# Patient Record
Sex: Male | Born: 1955
Health system: Southern US, Community
[De-identification: ages and names within clinical notes are randomized; demographics above are authoritative.]

## PROBLEM LIST (undated history)

## (undated) DIAGNOSIS — R7303 Prediabetes: Secondary | ICD-10-CM

## (undated) DIAGNOSIS — R519 Headache, unspecified: Secondary | ICD-10-CM

## (undated) DIAGNOSIS — K219 Gastro-esophageal reflux disease without esophagitis: Secondary | ICD-10-CM

## (undated) DIAGNOSIS — G4700 Insomnia, unspecified: Secondary | ICD-10-CM

## (undated) DIAGNOSIS — K5730 Diverticulosis of large intestine without perforation or abscess without bleeding: Secondary | ICD-10-CM

## (undated) DIAGNOSIS — R440 Auditory hallucinations: Secondary | ICD-10-CM

## (undated) DIAGNOSIS — N529 Male erectile dysfunction, unspecified: Secondary | ICD-10-CM

## (undated) DIAGNOSIS — G20A1 Parkinson's disease without dyskinesia, without mention of fluctuations: Secondary | ICD-10-CM

## (undated) DIAGNOSIS — F900 Attention-deficit hyperactivity disorder, predominantly inattentive type: Secondary | ICD-10-CM

## (undated) DIAGNOSIS — M5430 Sciatica, unspecified side: Secondary | ICD-10-CM

## (undated) DIAGNOSIS — R972 Elevated prostate specific antigen [PSA]: Secondary | ICD-10-CM

## (undated) DIAGNOSIS — E7800 Pure hypercholesterolemia, unspecified: Secondary | ICD-10-CM

## (undated) DIAGNOSIS — I10 Essential (primary) hypertension: Secondary | ICD-10-CM

## (undated) DIAGNOSIS — I493 Ventricular premature depolarization: Secondary | ICD-10-CM

## (undated) DIAGNOSIS — R400 Somnolence: Secondary | ICD-10-CM

## (undated) DIAGNOSIS — Z87442 Personal history of urinary calculi: Secondary | ICD-10-CM

## (undated) DIAGNOSIS — F32A Depression, unspecified: Secondary | ICD-10-CM

## (undated) DIAGNOSIS — R319 Hematuria, unspecified: Secondary | ICD-10-CM

## (undated) DIAGNOSIS — L659 Nonscarring hair loss, unspecified: Secondary | ICD-10-CM

## (undated) DIAGNOSIS — F419 Anxiety disorder, unspecified: Secondary | ICD-10-CM

## (undated) HISTORY — DX: Gastro-esophageal reflux disease without esophagitis: K21.9

## (undated) HISTORY — DX: Somnolence: R40.0

## (undated) HISTORY — DX: Nonscarring hair loss, unspecified: L65.9

## (undated) HISTORY — DX: Attention-deficit hyperactivity disorder, predominantly inattentive type: F90.0

## (undated) HISTORY — DX: Prediabetes: R73.03

## (undated) HISTORY — DX: Male erectile dysfunction, unspecified: N52.9

## (undated) HISTORY — DX: Sciatica, unspecified side: M54.30

## (undated) HISTORY — DX: Parkinson's disease without dyskinesia, without mention of fluctuations: G20.A1

## (undated) HISTORY — PX: OTHER SURGICAL HISTORY: SHX169

## (undated) HISTORY — DX: Hematuria, unspecified: R31.9

## (undated) HISTORY — DX: Diverticulosis of large intestine without perforation or abscess without bleeding: K57.30

## (undated) HISTORY — DX: Insomnia, unspecified: G47.00

## (undated) HISTORY — DX: Elevated prostate specific antigen (PSA): R97.20

## (undated) HISTORY — DX: Essential (primary) hypertension: I10

## (undated) HISTORY — DX: Pure hypercholesterolemia, unspecified: E78.00

## (undated) HISTORY — PX: NO PAST SURGERIES: SHX2092

## (undated) HISTORY — DX: Auditory hallucinations: R44.0

---

## 1998-12-10 ENCOUNTER — Encounter: Payer: Self-pay | Admitting: Family Medicine

## 1998-12-10 ENCOUNTER — Ambulatory Visit (HOSPITAL_COMMUNITY): Admission: RE | Admit: 1998-12-10 | Discharge: 1998-12-10 | Payer: Self-pay | Admitting: Family Medicine

## 2000-01-05 ENCOUNTER — Ambulatory Visit (HOSPITAL_COMMUNITY): Admission: RE | Admit: 2000-01-05 | Discharge: 2000-01-05 | Payer: Self-pay

## 2003-04-15 ENCOUNTER — Ambulatory Visit (HOSPITAL_COMMUNITY): Admission: RE | Admit: 2003-04-15 | Discharge: 2003-04-15 | Payer: Self-pay | Admitting: Gastroenterology

## 2004-08-16 ENCOUNTER — Ambulatory Visit (HOSPITAL_COMMUNITY): Admission: RE | Admit: 2004-08-16 | Discharge: 2004-08-16 | Payer: Self-pay | Admitting: Urology

## 2004-08-16 ENCOUNTER — Emergency Department (HOSPITAL_COMMUNITY): Admission: EM | Admit: 2004-08-16 | Discharge: 2004-08-17 | Payer: Self-pay | Admitting: Emergency Medicine

## 2005-10-19 ENCOUNTER — Ambulatory Visit (HOSPITAL_BASED_OUTPATIENT_CLINIC_OR_DEPARTMENT_OTHER): Admission: RE | Admit: 2005-10-19 | Discharge: 2005-10-19 | Payer: Self-pay | Admitting: Urology

## 2006-08-25 ENCOUNTER — Ambulatory Visit (HOSPITAL_BASED_OUTPATIENT_CLINIC_OR_DEPARTMENT_OTHER): Admission: RE | Admit: 2006-08-25 | Discharge: 2006-08-25 | Payer: Self-pay | Admitting: Orthopedic Surgery

## 2010-09-23 ENCOUNTER — Encounter
Admission: RE | Admit: 2010-09-23 | Discharge: 2010-09-23 | Payer: Self-pay | Source: Home / Self Care | Attending: Family Medicine | Admitting: Family Medicine

## 2012-05-07 ENCOUNTER — Other Ambulatory Visit: Payer: Self-pay | Admitting: Occupational Medicine

## 2012-05-07 ENCOUNTER — Ambulatory Visit (HOSPITAL_BASED_OUTPATIENT_CLINIC_OR_DEPARTMENT_OTHER)
Admission: RE | Admit: 2012-05-07 | Discharge: 2012-05-07 | Disposition: A | Discharge status: Home / Self Care | Payer: Self-pay | Source: Ambulatory Visit | Attending: Occupational Medicine | Admitting: Occupational Medicine

## 2012-05-07 DIAGNOSIS — Z0000 Encounter for general adult medical examination without abnormal findings: Secondary | ICD-10-CM

## 2013-08-20 ENCOUNTER — Other Ambulatory Visit: Payer: Self-pay | Admitting: Gastroenterology

## 2014-03-08 ENCOUNTER — Encounter (HOSPITAL_BASED_OUTPATIENT_CLINIC_OR_DEPARTMENT_OTHER): Payer: Self-pay | Admitting: Emergency Medicine

## 2014-03-08 ENCOUNTER — Emergency Department (HOSPITAL_BASED_OUTPATIENT_CLINIC_OR_DEPARTMENT_OTHER)
Admission: EM | Admit: 2014-03-08 | Discharge: 2014-03-08 | Disposition: A | Discharge status: Home / Self Care | Payer: 59 | Attending: Emergency Medicine | Admitting: Emergency Medicine

## 2014-03-08 DIAGNOSIS — R109 Unspecified abdominal pain: Secondary | ICD-10-CM | POA: Insufficient documentation

## 2014-03-08 DIAGNOSIS — R300 Dysuria: Secondary | ICD-10-CM | POA: Insufficient documentation

## 2014-03-08 DIAGNOSIS — N529 Male erectile dysfunction, unspecified: Secondary | ICD-10-CM | POA: Insufficient documentation

## 2014-03-08 DIAGNOSIS — R319 Hematuria, unspecified: Secondary | ICD-10-CM | POA: Insufficient documentation

## 2014-03-08 DIAGNOSIS — I10 Essential (primary) hypertension: Secondary | ICD-10-CM | POA: Insufficient documentation

## 2014-03-08 DIAGNOSIS — R350 Frequency of micturition: Secondary | ICD-10-CM | POA: Insufficient documentation

## 2014-03-08 DIAGNOSIS — Z79899 Other long term (current) drug therapy: Secondary | ICD-10-CM | POA: Insufficient documentation

## 2014-03-08 LAB — URINALYSIS, ROUTINE W REFLEX MICROSCOPIC
Bilirubin Urine: NEGATIVE
Glucose, UA: NEGATIVE mg/dL
Ketones, ur: NEGATIVE mg/dL
Leukocytes, UA: NEGATIVE
NITRITE: NEGATIVE
Protein, ur: NEGATIVE mg/dL
SPECIFIC GRAVITY, URINE: 1.013 (ref 1.005–1.030)
Urobilinogen, UA: 0.2 mg/dL (ref 0.0–1.0)
pH: 5 (ref 5.0–8.0)

## 2014-03-08 LAB — CBC WITH DIFFERENTIAL/PLATELET
Basophils Absolute: 0 10*3/uL (ref 0.0–0.1)
Basophils Relative: 1 % (ref 0–1)
EOS PCT: 3 % (ref 0–5)
Eosinophils Absolute: 0.2 10*3/uL (ref 0.0–0.7)
HCT: 37.8 % — ABNORMAL LOW (ref 39.0–52.0)
Hemoglobin: 12.9 g/dL — ABNORMAL LOW (ref 13.0–17.0)
LYMPHS PCT: 37 % (ref 12–46)
Lymphs Abs: 2.5 10*3/uL (ref 0.7–4.0)
MCH: 29.6 pg (ref 26.0–34.0)
MCHC: 34.1 g/dL (ref 30.0–36.0)
MCV: 86.7 fL (ref 78.0–100.0)
Monocytes Absolute: 0.7 10*3/uL (ref 0.1–1.0)
Monocytes Relative: 10 % (ref 3–12)
NEUTROS ABS: 3.3 10*3/uL (ref 1.7–7.7)
Neutrophils Relative %: 49 % (ref 43–77)
PLATELETS: 257 10*3/uL (ref 150–400)
RBC: 4.36 MIL/uL (ref 4.22–5.81)
RDW: 13.9 % (ref 11.5–15.5)
WBC: 6.7 10*3/uL (ref 4.0–10.5)

## 2014-03-08 LAB — COMPREHENSIVE METABOLIC PANEL
ALT: 16 U/L (ref 0–53)
AST: 17 U/L (ref 0–37)
Albumin: 3.9 g/dL (ref 3.5–5.2)
Alkaline Phosphatase: 59 U/L (ref 39–117)
Anion gap: 12 (ref 5–15)
BUN: 15 mg/dL (ref 6–23)
CALCIUM: 9 mg/dL (ref 8.4–10.5)
CHLORIDE: 102 meq/L (ref 96–112)
CO2: 25 mEq/L (ref 19–32)
Creatinine, Ser: 0.9 mg/dL (ref 0.50–1.35)
GFR calc Af Amer: >90 mL/min (ref >90)
Glucose, Bld: 99 mg/dL (ref 70–99)
Potassium: 3.9 mEq/L (ref 3.7–5.3)
SODIUM: 139 meq/L (ref 137–147)
Total Bilirubin: 0.3 mg/dL (ref 0.3–1.2)
Total Protein: 7.2 g/dL (ref 6.0–8.3)

## 2014-03-08 LAB — URINE MICROSCOPIC-ADD ON

## 2014-03-08 LAB — LIPASE, BLOOD: Lipase: 58 U/L (ref 11–59)

## 2014-03-08 MED ORDER — SODIUM CHLORIDE 0.9 % IV SOLN
1000.0000 mL | Freq: Once | INTRAVENOUS | Status: AC
  Administered 2014-03-08: 1000 mL via INTRAVENOUS

## 2014-03-08 MED ORDER — SODIUM CHLORIDE 0.9 % IV SOLN: 1000.0000 mL | INTRAVENOUS | Status: DC

## 2014-03-08 NOTE — ED Notes (Signed)
Pt reports hematuria and bilateral flank pain that started today.  Urinary frequency, dysuria, and urgency.

## 2014-03-08 NOTE — ED Provider Notes (Signed)
CSN: [id number]     Arrival date & time 03/08/14  3532 History  This chart was scribed for Dorie Rank, MD by Irene Pap, ED Scribe. This patient was seen in room MH07/MH07 and patient care was started at 7:50 PM.    Chief Complaint  Patient presents with  . Hematuria   The history is provided by the patient. No language interpreter was used.   HPI Comments: Bryan Sosa is a 58 y.o. male with a history of GERD who presents to the Emergency Department complaining of hematuria onset earlier today. He reports two episodes of hematuria today. He states the blood is bright red. He reports associated bilateral flank pain, frequency and dysuria. He reports that he was dehydrated a few days ago, feeling light headedness, lowered BP and heavy diarrhea, but these symptoms have since subsided when he stopped taking his BP medication. He denies fever, nausea and vomiting.   Past Medical History  Diagnosis Date  . ED (erectile dysfunction)   . GERD (gastroesophageal reflux disease)   . Insomnia   . Hypertension    History reviewed. No pertinent past surgical history. Family History  Problem Relation Age of Onset  . Ulcers Mother   . Heart attack Father    History  Substance Use Topics  . Smoking status: Never Smoker   . Smokeless tobacco: Not on file  . Alcohol Use: Not on file    Review of Systems  Constitutional: Negative for fever.  Gastrointestinal: Negative for nausea and vomiting.  Genitourinary: Positive for dysuria, frequency, hematuria and flank pain.  A complete 10 system review of systems was obtained and all systems are negative except as noted in the HPI and PMH.   Allergies  Cyclobenzaprine and Sulfa antibiotics  Home Medications   Prior to Admission medications   Medication Sig Start Date End Date Taking? Authorizing Provider  diphenhydrAMINE (BENADRYL) 25 mg capsule Take 25 mg by mouth every 6 (six) hours as needed.    Historical Provider, MD  dutasteride  (AVODART) 0.5 MG capsule Take 0.5 mg by mouth daily.    Historical Provider, MD  hydrochlorothiazide (HYDRODIURIL) 25 MG tablet Take 25 mg by mouth daily.    Historical Provider, MD  metoprolol succinate (TOPROL-XL) 25 MG 24 hr tablet Take 25 mg by mouth daily.    Historical Provider, MD  RABEprazole (ACIPHEX) 20 MG tablet Take 20 mg by mouth daily.    Historical Provider, MD  tadalafil (CIALIS) 5 MG tablet Take 5 mg by mouth daily as needed for erectile dysfunction.    Historical Provider, MD  valsartan (DIOVAN) 320 MG tablet Take 320 mg by mouth daily.    Historical Provider, MD   BP 122/74  Pulse 78  Temp(Src) 97.4 F (36.3 C) (Oral)  Resp 18  Ht 6\' 2"  (1.88 m)  Wt 220 lb (99.791 kg)  BMI 28.23 kg/m2  SpO2 98% Physical Exam  Nursing note and vitals reviewed. Constitutional: He appears well-developed and well-nourished. No distress.  HENT:  Head: Normocephalic and atraumatic.  Right Ear: External ear normal.  Left Ear: External ear normal.  Eyes: Conjunctivae are normal. Right eye exhibits no discharge. Left eye exhibits no discharge. No scleral icterus.  Neck: Neck supple. No tracheal deviation present.  Cardiovascular: Normal rate, regular rhythm and intact distal pulses.   Pulmonary/Chest: Effort normal and breath sounds normal. No stridor. No respiratory distress. He has no wheezes. He has no rales.  Abdominal: Soft. Bowel sounds are normal. He exhibits  no distension. There is no tenderness. There is no rebound and no guarding.  Musculoskeletal: He exhibits no edema and no tenderness.  Neurological: He is alert. He has normal strength. No cranial nerve deficit (no facial droop, extraocular movements intact, no slurred speech) or sensory deficit. He exhibits normal muscle tone. He displays no seizure activity. Coordination normal.  Skin: Skin is warm and dry. No rash noted.  Psychiatric: He has a normal mood and affect.   ED Course  Procedures (including critical care  time) DIAGNOSTIC STUDIES: Oxygen Saturation is 98% on room air, normal by my interpretation.    COORDINATION OF CARE: 7:53 PM-Discussed treatment plan which includes labs with pt at bedside and pt agreed to plan.   Labs Review Labs Reviewed  URINALYSIS, ROUTINE W REFLEX MICROSCOPIC - Abnormal; Notable for the following:    Hgb urine dipstick TRACE (*)    All other components within normal limits  CBC WITH DIFFERENTIAL - Abnormal; Notable for the following:    Hemoglobin 12.9 (*)    HCT 37.8 (*)    All other components within normal limits  URINE MICROSCOPIC-ADD ON - Abnormal; Notable for the following:    Casts HYALINE CASTS (*)    All other components within normal limits  URINE CULTURE  COMPREHENSIVE METABOLIC PANEL  LIPASE, BLOOD     MDM   Final diagnoses:  Hematuria   Trace hemoglobin but no obvious uti based on UA.  Will send off urine culture.  Pt is not having significant pain.  Discussed CT scan in the ED vs outpatient follow up.  Pt was primarily concerned about the blood.  It is possible he had a kidney stone.  Pt will follow up with PCP to have the urine retested next week.   Return to ED for recurrent, worsening symptoms  I personally performed the services described in this documentation, which was scribed in my presence.  The recorded information has been reviewed and is accurate.    Dorie Rank, MD 03/08/14 2122

## 2014-03-08 NOTE — Discharge Instructions (Signed)
Hematuria, Adult  Hematuria is blood in your urine. It can be caused by a bladder infection, kidney infection, prostate infection, kidney stone, or cancer of your urinary tract. Infections can usually be treated with medicine, and a kidney stone usually will pass through your urine. If neither of these is the cause of your hematuria, further workup to find out the reason may be needed.  It is very important that you tell your health care provider about any blood you see in your urine, even if the blood stops without treatment or happens without causing pain. Blood in your urine that happens and then stops and then happens again can be a symptom of a very serious condition. Also, pain is not a symptom in the initial stages of many urinary cancers.  HOME CARE INSTRUCTIONS   · Drink lots of fluid, 3-4 quarts a day. If you have been diagnosed with an infection, cranberry juice is especially recommended, in addition to large amounts of water.  · Avoid caffeine, tea, and carbonated beverages, because they tend to irritate the bladder.  · Avoid alcohol because it may irritate the prostate.  · Only take over-the-counter or prescription medicines for pain, discomfort, or fever as directed by your health care provider.  · If you have been diagnosed with a kidney stone, follow your health care provider's instructions regarding straining your urine to catch the stone.  · Empty your bladder often. Avoid holding urine for long periods of time.  · After a bowel movement, women should cleanse front to back. Use each tissue only once.  · Empty your bladder before and after sexual intercourse if you are a male.  SEEK MEDICAL CARE IF:  You develop back pain, fever, a feeling of sickness in your stomach (nausea), or vomiting or if your symptoms are not better in 3 days. Return sooner if you are getting worse.  SEEK IMMEDIATE MEDICAL CARE IF:   · You have a persistent fever, with a temperature of 101.8°F (38.8°C) or greater.  · You  develop severe vomiting and are unable to keep the medicine down.  · You develop severe back or abdominal pain despite taking your medicines.  · You begin passing a large amount of blood or clots in your urine.  · You feel extremely weak or faint, or you pass out.  MAKE SURE YOU:   · Understand these instructions.  · Will watch your condition.  · Will get help right away if you are not doing well or get worse.  Document Released: 08/15/2005 Document Revised: 06/05/2013 Document Reviewed: 04/15/2013  Exit[employer]® Patient Information ©2015 [employer]. This information is not intended to replace advice given to you by your health care provider. Make sure you discuss any questions you have with your health care provider.

## 2014-03-10 LAB — URINE CULTURE
Colony Count: NO GROWTH
Culture: NO GROWTH

## 2014-10-08 ENCOUNTER — Other Ambulatory Visit: Payer: Self-pay | Admitting: *Deleted

## 2014-10-08 DIAGNOSIS — R002 Palpitations: Secondary | ICD-10-CM

## 2014-10-10 ENCOUNTER — Encounter: Payer: Self-pay | Admitting: Radiology

## 2014-10-10 ENCOUNTER — Encounter (INDEPENDENT_AMBULATORY_CARE_PROVIDER_SITE_OTHER): Payer: 59

## 2014-10-10 DIAGNOSIS — R002 Palpitations: Secondary | ICD-10-CM

## 2014-10-10 NOTE — Progress Notes (Signed)
Patient ID: Bryan Sosa, male   DOB: Jan 06, 1956, 59 y.o.   MRN: [id number] Lif[employer] 30 day monitor applied. EOS 11-08-14

## 2015-05-07 ENCOUNTER — Ambulatory Visit (INDEPENDENT_AMBULATORY_CARE_PROVIDER_SITE_OTHER): Payer: 59 | Admitting: Cardiology

## 2015-05-07 ENCOUNTER — Encounter: Payer: Self-pay | Admitting: Cardiology

## 2015-05-07 VITALS — BP 130/82 | HR 74 | Ht 73.0 in | Wt 234.6 lb

## 2015-05-07 DIAGNOSIS — E669 Obesity, unspecified: Secondary | ICD-10-CM | POA: Diagnosis not present

## 2015-05-07 DIAGNOSIS — Z8249 Family history of ischemic heart disease and other diseases of the circulatory system: Secondary | ICD-10-CM | POA: Diagnosis not present

## 2015-05-07 DIAGNOSIS — I10 Essential (primary) hypertension: Secondary | ICD-10-CM | POA: Diagnosis not present

## 2015-05-07 DIAGNOSIS — R079 Chest pain, unspecified: Secondary | ICD-10-CM | POA: Diagnosis not present

## 2015-05-07 DIAGNOSIS — Z6834 Body mass index (BMI) 34.0-34.9, adult: Secondary | ICD-10-CM | POA: Insufficient documentation

## 2015-05-07 HISTORY — DX: Body mass index (BMI) 34.0-34.9, adult: Z68.34

## 2015-05-07 HISTORY — DX: Essential (primary) hypertension: I10

## 2015-05-07 NOTE — Progress Notes (Signed)
Cardiology Office Note   Date:  05/07/2015   ID:  Bryan Sosa, DOB 05/13/1956, MRN [id number]  PCP:  Vena Austria, MD  Cardiologist:   Candee Furbish, MD      History of Present Illness: Bryan Sosa is a 59 y.o. male who presents for evaluation of chest pain. Describes the chest pain as "chest twinges ". Can be sitting and have it. Last few days ok. Heart pinch. Has had GERD. Mild dyspnea with exertion.   Anterior chest pain with history of right bundle branch block was father died of massive heart attack in his early 27s. No associated shortness of breath. At one point, sitting when he felt discomfort. No vomiting.  In 20's PVC's, benign were diagnosed.   Retired Agricultural consultant.  He is also taking 3 antihypertensives. We discussed this. Weight loss. Obesity.  Was given meloxicam in case it was musculoskeletal discomfort.  Past Medical History  Diagnosis Date  . ED (erectile dysfunction)   . GERD (gastroesophageal reflux disease)   . Insomnia   . Hypertension     Past Surgical History  Procedure Laterality Date  . No past surgeries       Current Outpatient Prescriptions  Medication Sig Dispense Refill  . amLODipine (NORVASC) 5 MG tablet Take 5 mg by mouth daily.    Marland Kitchen aspirin 325 MG tablet Take 325 mg by mouth daily.    . diphenhydrAMINE (BENADRYL) 25 mg capsule Take 25 mg by mouth every 6 (six) hours as needed.    . dutasteride (AVODART) 0.5 MG capsule Take 0.5 mg by mouth daily.    . meloxicam (MOBIC) 15 MG tablet     . metoprolol succinate (TOPROL-XL) 25 MG 24 hr tablet Take 25 mg by mouth daily.    . metoprolol tartrate (LOPRESSOR) 25 MG tablet     . RABEprazole (ACIPHEX) 20 MG tablet Take 20 mg by mouth daily.    . tadalafil (CIALIS) 5 MG tablet Take 5 mg by mouth daily as needed for erectile dysfunction.    . valsartan (DIOVAN) 320 MG tablet Take 320 mg by mouth daily.     No current facility-administered medications for this visit.    Allergies:    Cyclobenzaprine and Sulfa antibiotics    Social History:  The patient  reports that he has never smoked. He does not have any smokeless tobacco history on file.   Family History:  The patient's family history includes Heart attack in his father; Ulcers in his mother.    ROS:  Please see the history of present illness.   Otherwise, review of systems are positive for none.   All other systems are reviewed and negative.    PHYSICAL EXAM: VS:  BP 130/82 mmHg  Pulse 74  Ht 6\' 1"  (1.854 m)  Wt 234 lb 9.6 oz (106.414 kg)  BMI 30.96 kg/m2 , BMI Body mass index is 30.96 kg/(m^2). GEN: Well nourished, well developed, in no acute distress HEENT: normal Neck: no JVD, carotid bruits, or masses Cardiac: RRR; no murmurs, rubs, or gallops,no edema  Respiratory:  clear to auscultation bilaterally, normal work of breathing GI: soft, nontender, nondistended, + BS MS: no deformity or atrophy Skin: warm and dry, no rash Neuro:  Strength and sensation are intact Psych: euthymic mood, full affect   EKG:  EKG is ordered today. The ekg ordered today 05/07/15 shows normal sinus rhythm, 74 with incomplete right bundle-branch pattern.   Recent Labs: No results found for requested labs  within last 365 days.    Lipid Panel No results found for: CHOL, TRIG, HDL, CHOLHDL, VLDL, LDLCALC, LDLDIRECT    Wt Readings from Last 3 Encounters:  05/07/15 234 lb 9.6 oz (106.414 kg)  03/08/14 220 lb (99.791 kg)    Prior lab work from July 2015 showed normal liver function, creatinine 0.9, hemoglobin 12.9  Other studies Reviewed: Additional studies/ records that were reviewed today include: Prior office notes reviewed, EKG reviewed. Review of the above records demonstrates: As above   ASSESSMENT AND PLAN:  1.  Atypical chest pain-described as chest twinges, possibly musculoskeletal. Nonetheless, his father had heart attack at age 80, he is a retired Agricultural consultant which increases occupational exposure hazards/risk  of cardiovascular disease. We will go ahead and order a stress echocardiogram to exclude ischemia. If this is reassuring, he may proceed with walking/jogging.  2. Essential hypertension-3 antihypertensives. Weight loss will be helpful with this.  3. Obesity-technically BMI greater than 30. He was 15 pounds lighter in 2015. Continue to encourage walking, exercise, watch diet. Prevention.  4. Family history of CAD-father MI 60. He states that his lipids have been good, HDL has been low at times. Encourage exercise.   Current medicines are reviewed at length with the patient today.  The patient does not have concerns regarding medicines.  The following changes have been made:  no change  Labs/ tests ordered today include:   Orders Placed This Encounter  Procedures  . EKG 12-Lead  . Echo stress     Disposition:   I will follow-up with results of stress testing. In the future, if chest pain becomes more worrisome, I'll be happy to see him.  Bobby Rumpf, MD  05/07/2015 4:46 PM    [facility] [address], [place], [place]  [address] Phone: [phone]; Fax: [phone]

## 2015-05-07 NOTE — Patient Instructions (Signed)
Medication Instructions:  The current medical regimen is effective;  continue present plan and medications.  Testing/Procedures: Your physician has requested that you have a stress echocardiogram. For further information please visit [email or web address]. Please follow instruction sheet as given.  Follow-Up: Follow up as needed after testing.  Thank you for choosing [facility]!!     

## 2015-05-20 ENCOUNTER — Ambulatory Visit (HOSPITAL_COMMUNITY): Payer: 59 | Attending: Cardiology

## 2015-05-20 DIAGNOSIS — Z8249 Family history of ischemic heart disease and other diseases of the circulatory system: Secondary | ICD-10-CM | POA: Diagnosis not present

## 2015-05-20 DIAGNOSIS — E669 Obesity, unspecified: Secondary | ICD-10-CM | POA: Insufficient documentation

## 2015-05-20 DIAGNOSIS — R079 Chest pain, unspecified: Secondary | ICD-10-CM | POA: Insufficient documentation

## 2015-05-20 DIAGNOSIS — Z6830 Body mass index (BMI) 30.0-30.9, adult: Secondary | ICD-10-CM | POA: Diagnosis not present

## 2015-05-20 DIAGNOSIS — I10 Essential (primary) hypertension: Secondary | ICD-10-CM | POA: Insufficient documentation

## 2015-11-15 ENCOUNTER — Encounter (HOSPITAL_COMMUNITY): Payer: Self-pay | Admitting: Emergency Medicine

## 2015-11-15 ENCOUNTER — Emergency Department (HOSPITAL_COMMUNITY): Payer: 59

## 2015-11-15 ENCOUNTER — Emergency Department (HOSPITAL_COMMUNITY)
Admission: EM | Admit: 2015-11-15 | Discharge: 2015-11-15 | Disposition: A | Discharge status: Home / Self Care | Payer: 59 | Attending: Emergency Medicine | Admitting: Emergency Medicine

## 2015-11-15 DIAGNOSIS — Z8669 Personal history of other diseases of the nervous system and sense organs: Secondary | ICD-10-CM | POA: Insufficient documentation

## 2015-11-15 DIAGNOSIS — N529 Male erectile dysfunction, unspecified: Secondary | ICD-10-CM | POA: Insufficient documentation

## 2015-11-15 DIAGNOSIS — Z79899 Other long term (current) drug therapy: Secondary | ICD-10-CM | POA: Diagnosis not present

## 2015-11-15 DIAGNOSIS — R079 Chest pain, unspecified: Secondary | ICD-10-CM | POA: Insufficient documentation

## 2015-11-15 DIAGNOSIS — I10 Essential (primary) hypertension: Secondary | ICD-10-CM | POA: Diagnosis not present

## 2015-11-15 DIAGNOSIS — M545 Low back pain: Secondary | ICD-10-CM | POA: Diagnosis not present

## 2015-11-15 DIAGNOSIS — Z8719 Personal history of other diseases of the digestive system: Secondary | ICD-10-CM | POA: Insufficient documentation

## 2015-11-15 DIAGNOSIS — R002 Palpitations: Secondary | ICD-10-CM | POA: Insufficient documentation

## 2015-11-15 LAB — BASIC METABOLIC PANEL
Anion gap: 11 (ref 5–15)
BUN: 12 mg/dL (ref 6–20)
CHLORIDE: 105 mmol/L (ref 101–111)
CO2: 24 mmol/L (ref 22–32)
Calcium: 9 mg/dL (ref 8.9–10.3)
Creatinine, Ser: 0.9 mg/dL (ref 0.61–1.24)
GFR calc Af Amer: >60 mL/min (ref >60)
GFR calc non Af Amer: >60 mL/min (ref >60)
Glucose, Bld: 115 mg/dL — ABNORMAL HIGH (ref 65–99)
Potassium: 3.5 mmol/L (ref 3.5–5.1)
Sodium: 140 mmol/L (ref 135–145)

## 2015-11-15 LAB — CBC
HCT: 42.6 % (ref 39.0–52.0)
Hemoglobin: 14.2 g/dL (ref 13.0–17.0)
MCH: 28.7 pg (ref 26.0–34.0)
MCHC: 33.3 g/dL (ref 30.0–36.0)
MCV: 86.1 fL (ref 78.0–100.0)
PLATELETS: 294 10*3/uL (ref 150–400)
RBC: 4.95 MIL/uL (ref 4.22–5.81)
RDW: 13.9 % (ref 11.5–15.5)
WBC: 7.2 10*3/uL (ref 4.0–10.5)

## 2015-11-15 LAB — I-STAT TROPONIN, ED
Troponin i, poc: 0 ng/mL (ref 0.00–0.08)
Troponin i, poc: 0 ng/mL (ref 0.00–0.08)

## 2015-11-15 MED ORDER — ACETAMINOPHEN 500 MG PO TABS
1000.0000 mg | ORAL_TABLET | Freq: Once | ORAL | Status: AC
Start: 2015-11-15 — End: 2015-11-15
  Administered 2015-11-15: 1000 mg via ORAL
  Filled 2015-11-15: qty 2

## 2015-11-15 MED ORDER — KETOROLAC TROMETHAMINE 30 MG/ML IJ SOLN
30.0000 mg | Freq: Once | INTRAMUSCULAR | Status: AC
  Administered 2015-11-15: 30 mg via INTRAVENOUS
  Filled 2015-11-15: qty 1

## 2015-11-15 MED ORDER — ASPIRIN 81 MG PO CHEW
324.0000 mg | CHEWABLE_TABLET | Freq: Once | ORAL | Status: AC
  Administered 2015-11-15: 324 mg via ORAL
  Filled 2015-11-15: qty 4

## 2015-11-15 MED ORDER — NITROGLYCERIN 0.4 MG SL SUBL: 0.4000 mg | SUBLINGUAL_TABLET | SUBLINGUAL | Status: DC | PRN

## 2015-11-15 NOTE — ED Notes (Signed)
Pt reports waking up to feeling of palpitations this am.  Worse when laying on his L side.  Pt reports this has happened in the past and had a stress test - unremarkable.  Pt denies any SOB, dizziness or any cp at this time.  Pt is A&Ox 4.

## 2015-11-15 NOTE — ED Provider Notes (Signed)
CSN: [id number]     Arrival date & time 11/15/15  [id number] History   First MD Initiated Contact with Patient 11/15/15 0735     Chief Complaint  Patient presents with  . Chest Pain     (Consider location/radiation/quality/duration/timing/severity/associated sxs/prior Treatment) HPI Comments: Laying in bed, last night felt like couldn't get comfortable laying on either side Felt heart beating very fast Face felt flushed Heart racing, started around 3AM Felt better when laying flat on back versus laying on sides felt uncomfortable  Now having chest pressure x hours Feels it in left back, no other radiation No shortness of breath No numbness/weakness Nausea a few times, no vomiting No history of CP like this or palpitations, has had PVCs No exertional pain Father with MI in 78s No smoking Stress test in september    Patient is a 60 y.o. male presenting with chest pain.  Chest Pain Associated symptoms: palpitations   Associated symptoms: no abdominal pain, no back pain, no diaphoresis, no fever, no headache, no nausea, no shortness of breath and not vomiting     Past Medical History  Diagnosis Date  . ED (erectile dysfunction)   . GERD (gastroesophageal reflux disease)   . Insomnia   . Hypertension    Past Surgical History  Procedure Laterality Date  . No past surgeries     Family History  Problem Relation Age of Onset  . Ulcers Mother   . Heart attack Father    Social History  Substance Use Topics  . Smoking status: Never Smoker   . Smokeless tobacco: None  . Alcohol Use: None    Review of Systems  Constitutional: Negative for fever and diaphoresis.  HENT: Negative for sore throat.   Eyes: Negative for visual disturbance.  Respiratory: Negative for shortness of breath.   Cardiovascular: Positive for chest pain and palpitations. Negative for leg swelling (has noticed bilateral sock lines).  Gastrointestinal: Negative for nausea, vomiting and abdominal pain.   Genitourinary: Negative for difficulty urinating.  Musculoskeletal: Negative for back pain and neck stiffness.  Skin: Negative for rash.  Neurological: Negative for syncope and headaches.      Allergies  Sulfa antibiotics  Home Medications   Prior to Admission medications   Medication Sig Start Date End Date Taking? Authorizing Provider  amLODipine (NORVASC) 5 MG tablet Take 5 mg by mouth daily.   Yes Historical Provider, MD  dutasteride (AVODART) 0.5 MG capsule Take 0.5 mg by mouth daily.   Yes Historical Provider, MD  metoprolol tartrate (LOPRESSOR) 25 MG tablet Take 25 mg by mouth 2 (two) times daily.  04/25/15  Yes Historical Provider, MD  omeprazole (PRILOSEC) 20 MG capsule Take 20 mg by mouth daily.   Yes Historical Provider, MD  RABEprazole (ACIPHEX) 20 MG tablet Take 20 mg by mouth daily.   Yes Historical Provider, MD  tadalafil (CIALIS) 5 MG tablet Take 5 mg by mouth daily as needed for erectile dysfunction.   Yes Historical Provider, MD  valsartan (DIOVAN) 320 MG tablet Take 320 mg by mouth daily.   Yes Historical Provider, MD   BP 135/90 mmHg  Pulse 86  Temp(Src) 98.5 F (36.9 C) (Oral)  Resp 20  SpO2 94% Physical Exam  Constitutional: He is oriented to person, place, and time. He appears well-developed and well-nourished. No distress.  HENT:  Head: Normocephalic and atraumatic.  Eyes: Conjunctivae and EOM are normal.  Neck: Normal range of motion.  Cardiovascular: Normal rate, regular rhythm, normal heart sounds and  intact distal pulses.  Exam reveals no gallop and no friction rub.   No murmur heard. Pulmonary/Chest: Effort normal and breath sounds normal. No respiratory distress. He has no wheezes. He has no rales.  Abdominal: Soft. He exhibits no distension. There is no tenderness. There is no guarding.  Musculoskeletal: He exhibits no edema.  Tenderness left lower back paraspinal  Neurological: He is alert and oriented to person, place, and time.  Skin: Skin  is warm and dry. He is not diaphoretic.  Nursing note and vitals reviewed.   ED Course  Procedures (including critical care time) Labs Review Labs Reviewed  BASIC METABOLIC PANEL - Abnormal; Notable for the following:    Glucose, Bld 115 (*)    All other components within normal limits  CBC  I-STAT TROPOININ, ED    Imaging Review Dg Chest 2 View  11/15/2015  CLINICAL DATA:  Sudden onset of chest tightness.  Hypertension. EXAM: CHEST  2 VIEW COMPARISON:  May 07, 2012 FINDINGS: There is slight scarring in the left base. There is no edema or consolidation. The heart size and pulmonary vascularity are normal. No adenopathy. No pneumothorax. No bone lesions. IMPRESSION: Mild scarring left base.  No edema or consolidation. Electronically Signed   By: Lowella Grip III M.D.   On: 11/15/2015 07:54   I have personally reviewed and evaluated these images and lab results as part of my medical decision-making.   EKG Interpretation   Date/Time:  Sunday November 15 2015 07:22:01 EDT Ventricular Rate:  86 PR Interval:  160 QRS Duration: 103 QT Interval:  390 QTC Calculation: 466 R Axis:   20 Text Interpretation:  Sinus rhythm Low voltage, precordial leads Abnormal  R-wave progression, early transition Borderline T abnormalities, inferior  leads Baseline wander in lead(s) II V4 V5 Confirmed by DELO  MD, DOUGLAS  ([id number]) on 11/15/2015 7:27:06 AM      MDM   Final diagnoses:  Chest pain, unspecified chest pain type   60 year old male with a history of hypertension presents with concern for palpitations beginning last night and chest pressure. EKG shows no acute findings, nonspecific changes, no sign of delta waves, no Brugada, and shows a normal sinus rhythm.  Patient without dyspnea, no hypoxia, no tachypnea, no leg pain or asymmetric swelling, and doubt acute PE. Normal pulses bilaterally, no neuro symptoms, normal CXR and doubt dissection.  Initial troponin negative.   Patient  observed in the emergency department for 3 hours without sign of cardiac arrhythmia. Delta troponin was also negative.  Patient had stress test September 05/20/2015 without any signs of inducible ischemia.  Patient with pain in back which is reproducible.  Patient is borderline HEAR score (different depending method of calculation) and discussed options including observation admission versus outpatient follow-up with cardiologist. Discussed that patient does have some risk factors, and we can admit to the hospital, continue monitoring troponins and consider other testing, however given recent negative stress test, nonexertional chest pain, would feel comfortable with close Cardiology follow up.  Patient prefers to go home and follow up closely with Dr. Marlou Porch.  Patient given toradol and tylenol with resolution of pain. Patient discharged in stable condition with understanding of reasons to return.   Gareth Morgan, MD 11/15/15 1941

## 2015-11-15 NOTE — ED Notes (Signed)
Pt was awoken from sleep with feelings of a fast heart beat. Waited for three hours to see if it'd improve, but it didn't go away. Says now he feels it speed up whenever he moves, with a sharp pain in his left upper back. Denies feeling SOB, O2 sat 92% on room air. Denies any hx of smoking.

## 2016-09-13 DIAGNOSIS — H903 Sensorineural hearing loss, bilateral: Secondary | ICD-10-CM | POA: Diagnosis not present

## 2016-12-02 DIAGNOSIS — L03115 Cellulitis of right lower limb: Secondary | ICD-10-CM | POA: Diagnosis not present

## 2016-12-02 DIAGNOSIS — I10 Essential (primary) hypertension: Secondary | ICD-10-CM | POA: Diagnosis not present

## 2016-12-02 DIAGNOSIS — S70261A Insect bite (nonvenomous), right hip, initial encounter: Secondary | ICD-10-CM | POA: Diagnosis not present

## 2017-02-01 DIAGNOSIS — Z23 Encounter for immunization: Secondary | ICD-10-CM | POA: Diagnosis not present

## 2017-03-28 ENCOUNTER — Encounter (HOSPITAL_COMMUNITY): Payer: Self-pay | Admitting: Emergency Medicine

## 2017-03-28 ENCOUNTER — Emergency Department (HOSPITAL_COMMUNITY): Payer: 59

## 2017-03-28 ENCOUNTER — Emergency Department (HOSPITAL_COMMUNITY)
Admission: EM | Admit: 2017-03-28 | Discharge: 2017-03-28 | Disposition: A | Discharge status: Home / Self Care | Payer: 59 | Attending: Emergency Medicine | Admitting: Emergency Medicine

## 2017-03-28 DIAGNOSIS — R079 Chest pain, unspecified: Secondary | ICD-10-CM | POA: Diagnosis not present

## 2017-03-28 DIAGNOSIS — R42 Dizziness and giddiness: Secondary | ICD-10-CM | POA: Diagnosis not present

## 2017-03-28 DIAGNOSIS — R0789 Other chest pain: Secondary | ICD-10-CM | POA: Insufficient documentation

## 2017-03-28 DIAGNOSIS — R7989 Other specified abnormal findings of blood chemistry: Secondary | ICD-10-CM | POA: Diagnosis not present

## 2017-03-28 LAB — I-STAT TROPONIN, ED
TROPONIN I, POC: 0 ng/mL (ref 0.00–0.08)
Troponin i, poc: 0 ng/mL (ref 0.00–0.08)

## 2017-03-28 LAB — BASIC METABOLIC PANEL
Anion gap: 6 (ref 5–15)
BUN: 9 mg/dL (ref 6–20)
CALCIUM: 8.8 mg/dL — AB (ref 8.9–10.3)
CO2: 26 mmol/L (ref 22–32)
CREATININE: 0.9 mg/dL (ref 0.61–1.24)
Chloride: 105 mmol/L (ref 101–111)
GFR calc Af Amer: >60 mL/min (ref >60)
GLUCOSE: 115 mg/dL — AB (ref 65–99)
Potassium: 3.2 mmol/L — ABNORMAL LOW (ref 3.5–5.1)
SODIUM: 137 mmol/L (ref 135–145)

## 2017-03-28 LAB — CBC
HCT: 39.6 % (ref 39.0–52.0)
Hemoglobin: 13.6 g/dL (ref 13.0–17.0)
MCH: 28.9 pg (ref 26.0–34.0)
MCHC: 34.3 g/dL (ref 30.0–36.0)
MCV: 84.1 fL (ref 78.0–100.0)
PLATELETS: 242 10*3/uL (ref 150–400)
RBC: 4.71 MIL/uL (ref 4.22–5.81)
RDW: 13.2 % (ref 11.5–15.5)
WBC: 6.4 10*3/uL (ref 4.0–10.5)

## 2017-03-28 LAB — D-DIMER, QUANTITATIVE (NOT AT ARMC): D DIMER QUANT: 1.15 ug{FEU}/mL — AB (ref 0.00–0.50)

## 2017-03-28 MED ORDER — GI COCKTAIL ~~LOC~~
30.0000 mL | Freq: Once | ORAL | Status: AC
  Administered 2017-03-28: 30 mL via ORAL
  Filled 2017-03-28: qty 30

## 2017-03-28 MED ORDER — IOPAMIDOL (ISOVUE-370) INJECTION 76%
INTRAVENOUS | Status: AC
  Administered 2017-03-28: 100 mL
  Filled 2017-03-28: qty 100

## 2017-03-28 MED ORDER — POTASSIUM CHLORIDE CRYS ER 20 MEQ PO TBCR: 20.0000 meq | EXTENDED_RELEASE_TABLET | Freq: Two times a day (BID) | ORAL | 0 refills | Status: DC

## 2017-03-28 MED ORDER — POTASSIUM CHLORIDE CRYS ER 20 MEQ PO TBCR
40.0000 meq | EXTENDED_RELEASE_TABLET | Freq: Once | ORAL | Status: AC
  Administered 2017-03-28: 40 meq via ORAL
  Filled 2017-03-28: qty 2

## 2017-03-28 NOTE — Discharge Instructions (Signed)
Your blood work and imaging has been reassuring in the ED. Unknown cause of your chest pain. Feel that he would benefit from close follow-up with her cardiologist. Also recommend following up with her primary care doctor to determine if you need to stop taking her fluoxetine. You may also recommend follow-up with a neurologist.  Would recommend taking an over-the-counter Prilosec or Nexium for acid reflux.  If he develops any worsening symptoms return to the ED.

## 2017-03-28 NOTE — ED Provider Notes (Signed)
Medical screening examination/treatment/procedure(s) were conducted as a shared visit with non-physician practitioner(s) and myself.  I personally evaluated the patient during the encounter.   EKG Interpretation  Date/Time:  Tuesday March 28 2017 08:09:57 EDT Ventricular Rate:  66 PR Interval:  156 QRS Duration: 94 QT Interval:  426 QTC Calculation: 446 R Axis:   -7 Text Interpretation:  Normal sinus rhythm Incomplete right bundle branch block Nonspecific T wave abnormality Abnormal ECG no acute changes  Confirmed by Brantley Stage [phone]) on 03/28/2017 11:22:64 AM      61 year old male who presents with chest pain. States that symptoms started this morning at 3 AM. Pain with sharp and pressure-like on the left side that radiated towards the left scapula. Now has some residual pressure over the left scapula. Pain initially worsened with deep breathing, but now not associated with breathing. Symptoms seem to be worse when he is lying back. Not associated with movement, fall, heavy lifting. No difficulty breathing, syncope or near syncope. Yesterday did have some fluttering in his chest, and was told in the past that he has had benign PVCs.  He has a history of hypertension, family history of CAD. He has had a stress tests in 05/20/2015 that showed no inducible ischemia.  Overall he has a heart score of 3. His EKG shows nonspecific T-wave changes, but no acute ischemia or infarction. Initial troponin is negative. Serial troponin within normal limits. His d-dimer is positive, we'll obtain CT angiogram of the chest to rule out PE. Otherwise, no significant risk factors.  CTPE to show evidence of acute PE or other acute cardiopulmonary processes.   Patient also states that since starting fluoxetine recently, he has had some mild mental status changes. States that he often has to think a little harder before speaking, and at times can become faulty and more forgetful with his thinking. No expressive  aphasia or dysarthria. No neurological focal complaints. Neurological exam is in tact. CT head negative. Patient to discuss d/c of fluoxetine first with psychologist to see if symptoms improve. If not will require additional work-up.  Felt stable for management as outpatient. Strict return and follow-up instructions reviewed. He expressed understanding of all discharge instructions and felt comfortable with the plan of care.   Forde Dandy, MD 03/28/17 [phone]

## 2017-03-28 NOTE — ED Triage Notes (Signed)
Pt. Stated, Last night I had some chest pain and into my back.  Wife stated, he is having a problem communicating. Pt answered questions appropriate in triage.

## 2017-03-28 NOTE — ED Notes (Signed)
ED Provider at bedside. 

## 2017-03-28 NOTE — ED Notes (Signed)
Got patient undress on the monitor call bell at reach patient is resting with family at bedside

## 2017-04-06 NOTE — ED Provider Notes (Signed)
[facility] DEPT Provider Note   CSN: [id number] Arrival date & time: 03/28/17  0803     History   Chief Complaint Chief Complaint  Patient presents with  . Chest Pain  . Palpitations  . Back Pain    HPI Bryan Sosa is a 61 y.o. male.  HPI  61 year old Caucasian male past medical history significant for hypertension, GERD presents to the ED today with complaints of chest pain. Patient states the symptoms started approximately 3 AM this morning. Describes pain as sharp in nature and substernal. Also describes as pressure-like in the left side that radiates towards his left scapula. States that his symptoms have since resolved but now has some residual pressure over his left scapula. He states the pain was initially worse with deep breathing but denies any pleuritic chest pain this time. Chest pain was not exertional. The patient states that his symptoms seem worse at night when he lies flat. He denies any associated shortness of breath, nausea, emesis, diaphoresis. Denies any recent illness, cough, difficulty breathing, syncope, near syncope. Patient reports some palpitations yesterday and has a history of benign PVCs.  Patient denies any cardiac history. Denies any history of PE/DVT, prolonged immobilization, recent hospitalizations or surgeries, lower extremity edema, calf tenderness, tobacco use. He denies any significant family cardiac history. The patient did have a stress test and 04/2016 that showed no inducible ischemia.  Wife also notes the patient has had some mild mental status changes. She states that he often has to think a little harder before speaking and at times can become forgetful with his thinking. Denies any aphasia or dysarthria. States that he was recently started on fluoxetine for depression and makes IV about the time that his symptoms started 2-3 weeks ago. Felt this may be due to his medications. Denies any ataxia, vision changes, headaches.  The patient is  not trying for his symptoms. Nothing makes better or worse. Pt denies any fever, chill, ha, vision changes, lightheadedness, dizziness, congestion, neck pain, sob, cough, abd pain, n/v/d, urinary symptoms, change in bowel habits, melena, hematochezia, lower extremity paresthesias.  Past Medical History:  Diagnosis Date  . ED (erectile dysfunction)   . GERD (gastroesophageal reflux disease)   . Hypertension   . Insomnia     Patient Active Problem List   Diagnosis Date Noted  . Pain in the chest 05/07/2015  . Obesity 05/07/2015  . Family history of early CAD 05/07/2015  . Essential hypertension 05/07/2015    Past Surgical History:  Procedure Laterality Date  . NO PAST SURGERIES         Home Medications    Prior to Admission medications   Medication Sig Start Date End Date Taking? Authorizing Provider  ALPRAZolam Duanne Moron) 0.5 MG tablet Take 0.5 mg by mouth 2 (two) times daily.    Yes [provider]  aspirin-acetaminophen-caffeine (EXCEDRIN MIGRAINE) [phone] MG tablet Take 1 tablet by mouth every 6 (six) hours as needed for headache.   Yes [provider]  FLUoxetine (PROZAC) 20 MG capsule Take 60 mg by mouth daily.   Yes [provider]  metoprolol tartrate (LOPRESSOR) 25 MG tablet Take 50 mg by mouth 2 (two) times daily.  04/25/15  Yes [provider]  risperiDONE (RISPERDAL) 1 MG tablet Take 1 mg by mouth at bedtime.   Yes [provider]  tadalafil (CIALIS) 5 MG tablet Take 5 mg by mouth daily as needed for erectile dysfunction.   Yes [provider]  valsartan (  DIOVAN) 320 MG tablet Take 320 mg by mouth daily.   Yes [provider]  potassium chloride SA (K-DUR,KLOR-CON) 20 MEQ tablet Take 1 tablet (20 mEq total) by mouth 2 (two) times daily. 03/28/17   Doristine Devoid, PA-C    Family History Family History  Problem Relation Age of Onset  . Ulcers Mother   . Heart attack Father     Social  History Social History  Substance Use Topics  . Smoking status: Never Smoker  . Smokeless tobacco: Never Used  . Alcohol use No     Allergies   Sulfa antibiotics   Review of Systems Review of Systems  Constitutional: Negative for chills, diaphoresis and fever.  HENT: Negative for congestion.   Eyes: Negative for visual disturbance.  Respiratory: Negative for cough and shortness of breath.   Cardiovascular: Positive for chest pain. Negative for palpitations and leg swelling.  Gastrointestinal: Negative for abdominal pain, diarrhea, nausea and vomiting.  Genitourinary: Negative for dysuria, flank pain, frequency, hematuria and urgency.  Musculoskeletal: Negative for arthralgias and myalgias.  Skin: Negative for rash.  Neurological: Negative for dizziness, syncope, weakness, light-headedness, numbness and headaches.  Psychiatric/Behavioral: Positive for confusion. Negative for sleep disturbance. The patient is not nervous/anxious.      Physical Exam Updated Vital Signs BP 128/80   Pulse 74   Temp 97.6 F (36.4 C)   Resp 18   Ht 6\' 2"  (1.88 m)   Wt 99 kg (218 lb 5 oz)   SpO2 97%   BMI 28.03 kg/m   Physical Exam  Constitutional: He is oriented to person, place, and time. He appears well-developed and well-nourished.  Non-toxic appearance. No distress.  HENT:  Head: Normocephalic and atraumatic.  Nose: Nose normal.  Mouth/Throat: Oropharynx is clear and moist.  Eyes: Pupils are equal, round, and reactive to light. Conjunctivae and EOM are normal. Right eye exhibits no discharge. Left eye exhibits no discharge.  Neck: Normal range of motion. Neck supple. No JVD present. No tracheal deviation present.  Cardiovascular: Normal rate, regular rhythm, normal heart sounds and intact distal pulses.  Exam reveals no gallop and no friction rub.   No murmur heard. Pulmonary/Chest: Effort normal and breath sounds normal. No respiratory distress. He has no wheezes. He has no rales. He  exhibits no tenderness.  No hypoxia or tachypnea.  Abdominal: Soft. Bowel sounds are normal. He exhibits no distension. There is no tenderness. There is no rebound and no guarding.  Musculoskeletal: Normal range of motion.  No lower extremity edema or calf tenderness.  Lymphadenopathy:    He has no cervical adenopathy.  Neurological: He is alert and oriented to person, place, and time.  The patient is alert, attentive, and oriented x 3. Speech is clear. Cranial nerve II-VII grossly intact. Negative pronator drift. Sensation intact. Strength 5/5 in all extremities. Reflexes 2+ and symmetric at biceps, triceps, knees, and ankles. Rapid alternating movement and fine finger movements intact. Romberg is absent. Posture and gait normal.   Skin: Skin is warm and dry. Capillary refill takes less than 2 seconds. He is not diaphoretic.  Psychiatric: His behavior is normal. Judgment and thought content normal.  Nursing note and vitals reviewed.    ED Treatments / Results  Labs (all labs ordered are listed, but only abnormal results are displayed) Labs Reviewed  BASIC METABOLIC PANEL - Abnormal; Notable for the following:       Result Value   Potassium 3.2 (*)    Glucose, Bld 115 (*)  Calcium 8.8 (*)    All other components within normal limits  D-DIMER, QUANTITATIVE (NOT AT [facility]) - Abnormal; Notable for the following:    D-Dimer, Quant 1.15 (*)    All other components within normal limits  CBC  I-STAT TROPONIN, ED  I-STAT TROPONIN, ED    EKG  EKG Interpretation  Date/Time:  Tuesday March 28 2017 08:09:57 EDT Ventricular Rate:  66 PR Interval:  156 QRS Duration: 94 QT Interval:  426 QTC Calculation: 446 R Axis:   -7 Text Interpretation:  Normal sinus rhythm Incomplete right bundle branch block Nonspecific T wave abnormality Abnormal ECG no acute changes  Confirmed by Brantley Stage [phone]) on 03/28/2017 11:22:04 AM       Radiology No results found.  Procedures Procedures  (including critical care time)  Medications Ordered in ED Medications  gi cocktail (Maalox,Lidocaine,Donnatal) (30 mLs Oral Given 03/28/17 1028)  iopamidol (ISOVUE-370) 76 % injection (100 mLs  Contrast Given 03/28/17 1344)  potassium chloride SA (K-DUR,KLOR-CON) CR tablet 40 mEq (40 mEq Oral Given 03/28/17 1532)     Initial Impression / Assessment and Plan / ED Course  I have reviewed the triage vital signs and the nursing notes.  Pertinent labs & imaging results that were available during my care of the patient were reviewed by me and considered in my medical decision making (see chart for details).     Pt presents to the Ed today with complaints of cp. Patient is to be discharged with recommendation to follow up with PCP in regards to today's hospital visit. Chest pain is not likely of cardiac or pulmonary etiology d/t presentation, d dimer positive without any signs of pe on CTA of chest, VSS, no tracheal deviation, no JVD or new murmur, RRR, breath sounds equal bilaterally, EKG without any change from prior tracing and shows no signs of ischemia, negative delta troponin, and negative CXR. Pt symptoms improved with GI cocktail. Mild hypokalemia which was replaced orally. All other labs at baseline. Pt has been advised to return to the ED is CP becomes exertional, associated with diaphoresis or nausea, radiates to left jaw/arm, worsens or becomes concerning in any way.   Patient reports some mild confusion and altered mental status after starting fluoxetine. No focal neuro deficits. CT scan of head was unremarkable. Feel the patient's symptoms likely due to initiation of fluoxetine. Encourage patient to follow-up with psychiatrist and primary care doctor. Low suspicion for CVA given length of symptoms and no focal neuro deficits. Doubt any further workup at this time.  Pt appears reliable for follow up and is agreeable to discharge.   Case has been discussed with and seen by Dr. Oleta Mouse who agrees  with the above plan to discharge.     Final Clinical Impressions(s) / ED Diagnoses   Final diagnoses:  Atypical chest pain    New Prescriptions Discharge Medication List as of 03/28/2017  3:18 PM    START taking these medications   Details  potassium chloride SA (K-DUR,KLOR-CON) 20 MEQ tablet Take 1 tablet (20 mEq total) by mouth 2 (two) times daily., Starting Tue 03/28/2017, Print         Doristine Devoid, PA-C 04/06/17 1114    Forde Dandy, MD 04/06/17 [phone]

## 2017-06-22 DIAGNOSIS — Z23 Encounter for immunization: Secondary | ICD-10-CM | POA: Diagnosis not present

## 2017-06-22 DIAGNOSIS — I10 Essential (primary) hypertension: Secondary | ICD-10-CM | POA: Diagnosis not present

## 2017-06-22 DIAGNOSIS — I493 Ventricular premature depolarization: Secondary | ICD-10-CM | POA: Diagnosis not present

## 2018-01-04 DIAGNOSIS — I10 Essential (primary) hypertension: Secondary | ICD-10-CM | POA: Diagnosis not present

## 2018-01-04 DIAGNOSIS — Z0000 Encounter for general adult medical examination without abnormal findings: Secondary | ICD-10-CM | POA: Diagnosis not present

## 2018-01-04 DIAGNOSIS — Z23 Encounter for immunization: Secondary | ICD-10-CM | POA: Diagnosis not present

## 2018-01-04 DIAGNOSIS — I493 Ventricular premature depolarization: Secondary | ICD-10-CM | POA: Diagnosis not present

## 2018-08-08 ENCOUNTER — Encounter: Payer: Self-pay | Admitting: Emergency Medicine

## 2018-08-08 DIAGNOSIS — F429 Obsessive-compulsive disorder, unspecified: Secondary | ICD-10-CM

## 2018-08-08 DIAGNOSIS — G4700 Insomnia, unspecified: Secondary | ICD-10-CM | POA: Insufficient documentation

## 2018-08-08 DIAGNOSIS — F4010 Social phobia, unspecified: Secondary | ICD-10-CM

## 2018-08-08 HISTORY — DX: Obsessive-compulsive disorder, unspecified: F42.9

## 2018-08-08 HISTORY — DX: Social phobia, unspecified: F40.10

## 2018-08-15 ENCOUNTER — Ambulatory Visit: Payer: 59 | Admitting: Psychiatry

## 2018-08-15 ENCOUNTER — Encounter: Payer: Self-pay | Admitting: Psychiatry

## 2018-08-15 DIAGNOSIS — F429 Obsessive-compulsive disorder, unspecified: Secondary | ICD-10-CM | POA: Diagnosis not present

## 2018-08-15 DIAGNOSIS — F4010 Social phobia, unspecified: Secondary | ICD-10-CM | POA: Diagnosis not present

## 2018-08-15 DIAGNOSIS — F5105 Insomnia due to other mental disorder: Secondary | ICD-10-CM | POA: Diagnosis not present

## 2018-08-15 MED ORDER — DULOXETINE HCL 60 MG PO CPEP: 60.0000 mg | ORAL_CAPSULE | Freq: Every day | ORAL | 5 refills | Status: DC

## 2018-08-15 MED ORDER — RISPERIDONE 1 MG PO TABS: 1.0000 mg | ORAL_TABLET | Freq: Every day | ORAL | 5 refills | Status: DC

## 2018-08-15 MED ORDER — ALPRAZOLAM 0.5 MG PO TABS: ORAL_TABLET | ORAL | 5 refills | Status: DC

## 2018-08-15 NOTE — Progress Notes (Signed)
Bryan Sosa [id number] [birth year] 62 y.o.  Subjective:   Patient ID:  Bryan Sosa is a 62 y.o. (DOB 10-25-1955) male.  Chief Complaint:  Chief Complaint  Patient presents with  . Follow-up    Medication Management    HPI [name] presents to the office today for follow-up of OCD. Reduced risperidone from 1 to 1/2 mg Hs did reduce sleepiness.   No worsening of anxiety after the change.  Still has anxiety at times, no worse nor better.  No depression.  Sleep at least 8 hours.  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  Has retired.  Review of Systems:  Review of Systems  Neurological: Negative for tremors and weakness.  Psychiatric/Behavioral: Positive for sleep disturbance. Negative for agitation, behavioral problems, confusion, decreased concentration, dysphoric mood, hallucinations, self-injury and suicidal ideas. The patient is nervous/anxious. The patient is not hyperactive.     Medications: I have reviewed the patient's current medications.  Current Outpatient Medications  Medication Sig Dispense Refill  . ALPRAZolam (XANAX) 0.5 MG tablet 1/2 tablet 3 times daily and 1 at bedtime 75 tablet 5  . aspirin-acetaminophen-caffeine (EXCEDRIN MIGRAINE) 250-250-65 MG tablet Take 1 tablet by mouth every 6 (six) hours as needed for headache.    . DULoxetine (CYMBALTA) 60 MG capsule Take 1 capsule (60 mg total) by mouth daily. 30 capsule 5  . metoprolol tartrate (LOPRESSOR) 25 MG tablet Take 50 mg by mouth 2 (two) times daily.     . risperiDONE (RISPERDAL) 1 MG tablet Take 1 tablet (1 mg total) by mouth at bedtime. 30 tablet 5  . tadalafil (CIALIS) 5 MG tablet Take 5 mg by mouth daily as needed for erectile dysfunction.    . valsartan (DIOVAN) 320 MG tablet Take 320 mg by mouth daily.    . potassium chloride SA (K-DUR,KLOR-CON) 20 MEQ tablet Take 1 tablet (20 mEq total) by mouth 2 (two) times daily. (Patient not taking: Reported on 08/15/2018) 8 tablet 0   No current  facility-administered medications for this visit.     Medication Side Effects: None  Allergies:  Allergies  Allergen Reactions  . Sulfa Antibiotics Hives    Past Medical History:  Diagnosis Date  . ED (erectile dysfunction)   . GERD (gastroesophageal reflux disease)   . Hypertension   . Insomnia     Family History  Problem Relation Age of Onset  . Ulcers Mother   . Heart attack Father     Social History   Socioeconomic History  . Marital status: Married    Spouse name: Not on file  . Number of children: Not on file  . Years of education: Not on file  . Highest education level: Not on file  Occupational History  . Not on file  Social Needs  . Financial resource strain: Not on file  . Food insecurity:    Worry: Not on file    Inability: Not on file  . Transportation needs:    Medical: Not on file    Non-medical: Not on file  Tobacco Use  . Smoking status: Never Smoker  . Smokeless tobacco: Never Used  Substance and Sexual Activity  . Alcohol use: No  . Drug use: No  . Sexual activity: Not on file  Lifestyle  . Physical activity:    Days per week: Not on file    Minutes per session: Not on file  . Stress: Not on file  Relationships  . Social connections:  Talks on phone: Not on file    Gets together: Not on file    Attends religious service: Not on file    Active member of club or organization: Not on file    Attends meetings of clubs or organizations: Not on file    Relationship status: Not on file  . Intimate partner violence:    Fear of current or ex partner: Not on file    Emotionally abused: Not on file    Physically abused: Not on file    Forced sexual activity: Not on file  Other Topics Concern  . Not on file  Social History Narrative  . Not on file    Past Medical History, Surgical history, Social history, and Family history were reviewed and updated as appropriate.   Please see review of systems for further details on the patient's  review from today.   Objective:   Physical Exam:  There were no vitals taken for this visit.  Physical Exam Constitutional:      General: He is not in acute distress.    Appearance: Normal appearance. He is well-developed.  Musculoskeletal:        General: No deformity.  Neurological:     Mental Status: He is alert and oriented to person, place, and time.     Motor: No tremor.     Coordination: Coordination normal.     Gait: Gait normal.  Psychiatric:        Attention and Perception: Attention and perception normal.        Mood and Affect: Mood is anxious. Mood is not depressed. Affect is blunt. Affect is not labile, angry or inappropriate.        Speech: Speech is delayed.        Behavior: Behavior is slowed.        Thought Content: Thought content normal. Thought content does not include homicidal or suicidal ideation. Thought content does not include homicidal or suicidal plan.        Cognition and Memory: Cognition normal.        Judgment: Judgment normal.     Comments: Insight intact. No auditory or visual hallucinations. No delusions. Chronic scrupulosity obsessions. Chronic mild slowness predates risperidone      Lab Review:     Component Value Date/Time   NA 137 03/28/2017 0820   K 3.2 (L) 03/28/2017 0820   CL 105 03/28/2017 0820   CO2 26 03/28/2017 0820   GLUCOSE 115 (H) 03/28/2017 0820   BUN 9 03/28/2017 0820   CREATININE 0.90 03/28/2017 0820   CALCIUM 8.8 (L) 03/28/2017 0820   PROT 7.2 03/08/2014 2005   ALBUMIN 3.9 03/08/2014 2005   AST 17 03/08/2014 2005   ALT 16 03/08/2014 2005   ALKPHOS 59 03/08/2014 2005   BILITOT 0.3 03/08/2014 2005   GFRNONAA >60 03/28/2017 0820   GFRAA >60 03/28/2017 0820       Component Value Date/Time   WBC 6.4 03/28/2017 0820   RBC 4.71 03/28/2017 0820   HGB 13.6 03/28/2017 0820   HCT 39.6 03/28/2017 0820   PLT 242 03/28/2017 0820   MCV 84.1 03/28/2017 0820   MCH 28.9 03/28/2017 0820   MCHC 34.3 03/28/2017 0820    RDW 13.2 03/28/2017 0820   LYMPHSABS 2.5 03/08/2014 2005   MONOABS 0.7 03/08/2014 2005   EOSABS 0.2 03/08/2014 2005   BASOSABS 0.0 03/08/2014 2005    No results found for: POCLITH, LITHIUM   No results found for: PHENYTOIN, PHENOBARB,  VALPROATE, CBMZ   .res Assessment: Plan:    Obsessive-compulsive disorder with good or fair insight  Social anxiety disorder  Insomnia due to mental condition  Medication sensitive.  Disc usual course and response of OCD with meds and unlikely to fully resolve and need for chronic treatment.  Also usually need max dosages of SSRI but he's med sensitive. Importance of activity with retirement otherwise anxiety will be worse.  Option DC risperidone.  He's fearful of anxiety and insomnia getting worse.  We discussed the short-term risks associated with benzodiazepines including sedation and increased fall risk among others.  Discussed long-term side effect risk including dependence, potential withdrawal symptoms, and the potential eventual dose-related risk of dementia.  He doesn't want further med changes.  Fu 6 mos  Lynder Parents, MD, DFAPA    Please see After Visit Summary for patient specific instructions.  Future Appointments  Date Time Provider [place]  02/14/2019  4:00 PM Cottle, Billey Co., MD [facility] None    No orders of the defined types were placed in this encounter.     -------------------------------

## 2018-10-06 ENCOUNTER — Other Ambulatory Visit: Payer: Self-pay | Admitting: Psychiatry

## 2018-10-08 ENCOUNTER — Telehealth: Payer: Self-pay | Admitting: Psychiatry

## 2018-10-08 NOTE — Telephone Encounter (Signed)
Patient requesting refill of his Alprazolam. Refill at the [facility]

## 2018-10-08 NOTE — Telephone Encounter (Signed)
Submitted in dec with 5 additional refills will call pharmacy in the morning

## 2018-10-09 NOTE — Telephone Encounter (Signed)
Pt already picked up at pharmacy

## 2019-02-14 ENCOUNTER — Ambulatory Visit: Payer: 59 | Admitting: Psychiatry

## 2019-02-14 ENCOUNTER — Encounter: Payer: Self-pay | Admitting: Psychiatry

## 2019-02-14 ENCOUNTER — Other Ambulatory Visit: Payer: Self-pay

## 2019-02-14 DIAGNOSIS — F4010 Social phobia, unspecified: Secondary | ICD-10-CM | POA: Diagnosis not present

## 2019-02-14 DIAGNOSIS — F5105 Insomnia due to other mental disorder: Secondary | ICD-10-CM | POA: Diagnosis not present

## 2019-02-14 DIAGNOSIS — F429 Obsessive-compulsive disorder, unspecified: Secondary | ICD-10-CM

## 2019-02-14 MED ORDER — RISPERIDONE 1 MG PO TABS: 1.0000 mg | ORAL_TABLET | Freq: Every day | ORAL | 5 refills | Status: DC

## 2019-02-14 MED ORDER — ALPRAZOLAM 0.5 MG PO TABS: ORAL_TABLET | ORAL | 5 refills | Status: DC

## 2019-02-14 MED ORDER — DULOXETINE HCL 60 MG PO CPEP: 60.0000 mg | ORAL_CAPSULE | Freq: Every day | ORAL | 5 refills | Status: DC

## 2019-02-14 NOTE — Progress Notes (Signed)
Bryan Sosa [id number] December 03, 1955 63 y.o.  Subjective:   Patient ID:  Bryan Sosa is a 63 y.o. (DOB 1956/04/24) male.  Chief Complaint:  Chief Complaint  Patient presents with  . Anxiety    OCD, Med management    HPI Bryan Sosa presents to the office today for follow-up of OCD.  Last seen in December.  No meds were changed.  No longer problems with sleepiness.  Anxiety is good overall.  No sig problems with intrusive or obsessive thoughts now with the medicines.  Satisfied with meds.  Reduced risperidone from 1 to 1/2 mg Hs did reduce sleepiness.   No worsening of anxiety after the change.  Still has anxiety at times, no worse nor better.  No depression.  Sleep at least 8 hours. In bed 10 + hours bc retired.   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  Has retired.  Past Psychiatric Medication Trials: Sertraline diarrhea, fluvoxamine sedation, fluoxetine cognitive side effects, paroxetine side effects, risperidone 1 mg, Xanax Duloxetine 60 mg since September 2019  Review of Systems:  Review of Systems  Cardiovascular: Positive for palpitations.  Neurological: Negative for tremors and weakness.  Psychiatric/Behavioral: Positive for sleep disturbance. Negative for agitation, behavioral problems, confusion, decreased concentration, dysphoric mood, hallucinations, self-injury and suicidal ideas. The patient is nervous/anxious. The patient is not hyperactive.     Medications: I have reviewed the patient's current medications.  Current Outpatient Medications  Medication Sig Dispense Refill  . ALPRAZolam (XANAX) 0.5 MG tablet 1/2 tablet 3 times daily and 1 at bedtime 75 tablet 5  . aspirin-acetaminophen-caffeine (EXCEDRIN MIGRAINE) 250-250-65 MG tablet Take 1 tablet by mouth every 6 (six) hours as needed for headache.    . DULoxetine (CYMBALTA) 60 MG capsule Take 1 capsule (60 mg total) by mouth daily. 30 capsule 5  . metoprolol tartrate (LOPRESSOR) 25 MG tablet Take 50  mg by mouth 2 (two) times daily.     . risperiDONE (RISPERDAL) 1 MG tablet Take 1 tablet (1 mg total) by mouth at bedtime. (Patient taking differently: Take 0.5 mg by mouth at bedtime. ) 30 tablet 5  . valsartan (DIOVAN) 320 MG tablet Take 320 mg by mouth daily.    . tadalafil (CIALIS) 5 MG tablet Take 5 mg by mouth daily as needed for erectile dysfunction.     No current facility-administered medications for this visit.     Medication Side Effects: None  Allergies:  Allergies  Allergen Reactions  . Sulfa Antibiotics Hives    Past Medical History:  Diagnosis Date  . ED (erectile dysfunction)   . GERD (gastroesophageal reflux disease)   . Hypertension   . Insomnia     Family History  Problem Relation Age of Onset  . Ulcers Mother   . Heart attack Father     Social History   Socioeconomic History  . Marital status: Married    Spouse name: Not on file  . Number of children: Not on file  . Years of education: Not on file  . Highest education level: Not on file  Occupational History  . Not on file  Social Needs  . Financial resource strain: Not on file  . Food insecurity    Worry: Not on file    Inability: Not on file  . Transportation needs    Medical: Not on file    Non-medical: Not on file  Tobacco Use  . Smoking status: Never Smoker  . Smokeless tobacco: Never Used  Substance and Sexual Activity  . Alcohol use: No  . Drug use: No  . Sexual activity: Not on file  Lifestyle  . Physical activity    Days per week: Not on file    Minutes per session: Not on file  . Stress: Not on file  Relationships  . Social Herbalist on phone: Not on file    Gets together: Not on file    Attends religious service: Not on file    Active member of club or organization: Not on file    Attends meetings of clubs or organizations: Not on file    Relationship status: Not on file  . Intimate partner violence    Fear of current or ex partner: Not on file     Emotionally abused: Not on file    Physically abused: Not on file    Forced sexual activity: Not on file  Other Topics Concern  . Not on file  Social History Narrative  . Not on file    Past Medical History, Surgical history, Social history, and Family history were reviewed and updated as appropriate.   Please see review of systems for further details on the patient's review from today.   Objective:   Physical Exam:  There were no vitals taken for this visit.  Physical Exam Constitutional:      General: He is not in acute distress.    Appearance: Normal appearance. He is well-developed.  Musculoskeletal:        General: No deformity.  Neurological:     Mental Status: He is alert and oriented to person, place, and time.     Motor: No tremor.     Coordination: Coordination normal.     Gait: Gait normal.  Psychiatric:        Attention and Perception: Attention and perception normal.        Mood and Affect: Mood is anxious. Mood is not depressed. Affect is blunt. Affect is not labile, angry or inappropriate.        Speech: Speech is delayed.        Behavior: Behavior is slowed.        Thought Content: Thought content normal. Thought content does not include homicidal or suicidal ideation. Thought content does not include homicidal or suicidal plan.        Cognition and Memory: Cognition normal.        Judgment: Judgment normal.     Comments: Insight intact. No auditory or visual hallucinations. No delusions. Chronic scrupulosity obsessions but under good control Chronic mild slowness predates risperidone      Lab Review:     Component Value Date/Time   NA 137 03/28/2017 0820   K 3.2 (L) 03/28/2017 0820   CL 105 03/28/2017 0820   CO2 26 03/28/2017 0820   GLUCOSE 115 (H) 03/28/2017 0820   BUN 9 03/28/2017 0820   CREATININE 0.90 03/28/2017 0820   CALCIUM 8.8 (L) 03/28/2017 0820   PROT 7.2 03/08/2014 2005   ALBUMIN 3.9 03/08/2014 2005   AST 17 03/08/2014 2005   ALT  16 03/08/2014 2005   ALKPHOS 59 03/08/2014 2005   BILITOT 0.3 03/08/2014 2005   GFRNONAA >60 03/28/2017 0820   GFRAA >60 03/28/2017 0820       Component Value Date/Time   WBC 6.4 03/28/2017 0820   RBC 4.71 03/28/2017 0820   HGB 13.6 03/28/2017 0820   HCT 39.6 03/28/2017 0820   PLT 242 03/28/2017 0820  MCV 84.1 03/28/2017 0820   MCH 28.9 03/28/2017 0820   MCHC 34.3 03/28/2017 0820   RDW 13.2 03/28/2017 0820   LYMPHSABS 2.5 03/08/2014 2005   MONOABS 0.7 03/08/2014 2005   EOSABS 0.2 03/08/2014 2005   BASOSABS 0.0 03/08/2014 2005    No results found for: POCLITH, LITHIUM   No results found for: PHENYTOIN, PHENOBARB, VALPROATE, CBMZ   .res Assessment: Plan:    Bryan Sosa was seen today for anxiety.  Diagnoses and all orders for this visit:  Obsessive-compulsive disorder with good or fair insight  Social anxiety disorder  Insomnia due to mental condition  Medication sensitive.  Disc usual course and response of OCD with meds and unlikely to fully resolve and need for chronic treatment.  Also usually need max dosages of SSRI but he's med sensitive. Importance of activity with retirement otherwise anxiety will be worse.  Option DC risperidone.  He's fearful of anxiety and insomnia getting worse.  We discussed the short-term risks associated with benzodiazepines including sedation and increased fall risk among others.  Discussed long-term side effect risk including dependence, potential withdrawal symptoms, and the potential eventual dose-related risk of dementia. No evidence for TD.  He doesn't want further med changes.  Fu 6 mos  Lynder Parents, MD, DFAPA    Please see After Visit Summary for patient specific instructions.  No future appointments.  No orders of the defined types were placed in this encounter.     -------------------------------

## 2019-08-15 ENCOUNTER — Encounter: Payer: Self-pay | Admitting: Psychiatry

## 2019-08-15 ENCOUNTER — Other Ambulatory Visit: Payer: Self-pay

## 2019-08-15 ENCOUNTER — Ambulatory Visit (INDEPENDENT_AMBULATORY_CARE_PROVIDER_SITE_OTHER): Payer: 59 | Admitting: Psychiatry

## 2019-08-15 DIAGNOSIS — F5105 Insomnia due to other mental disorder: Secondary | ICD-10-CM

## 2019-08-15 DIAGNOSIS — F429 Obsessive-compulsive disorder, unspecified: Secondary | ICD-10-CM

## 2019-08-15 DIAGNOSIS — F4010 Social phobia, unspecified: Secondary | ICD-10-CM

## 2019-08-15 MED ORDER — RISPERIDONE 1 MG PO TABS
1.0000 mg | ORAL_TABLET | Freq: Every day | ORAL | 1 refills | Status: DC
Start: 2019-08-15 — End: 2020-03-04

## 2019-08-15 MED ORDER — DULOXETINE HCL 60 MG PO CPEP: 60.0000 mg | ORAL_CAPSULE | Freq: Every day | ORAL | 1 refills | Status: DC

## 2019-08-15 MED ORDER — ALPRAZOLAM 0.5 MG PO TABS: ORAL_TABLET | ORAL | 1 refills | Status: DC

## 2019-08-15 NOTE — Progress Notes (Signed)
Bryan Sosa [id number] 10/01/1955 63 y.o.  Subjective:   Patient ID:  Bryan Sosa is a 63 y.o. (DOB Aug 18, 1956) male.  Chief Complaint:  Chief Complaint  Patient presents with  . Follow-up    Medication Management  . Other    OCD  . Medication Problem    vivid dreams    HPI Bryan Sosa presents to the office today for follow-up of OCD.  Last seen in June 2020.  No meds were changed.  Pretty good.  Anxious from time to time. Wonders if meds cause bad dreams, but more often vivid dreams.    No longer problems with sleepiness.  Anxiety is good overall.  No sig problems with intrusive or obsessive thoughts now with the medicines.  Satisfied with meds.  Still dreams of working at [employer].  Helped to get OOB earlier with reduction Risperidone to 0.5 mg HS.  Likes having the 1 mg tablets.  Consistent with psych meds.  OCD is managed mostly.    Sporadically working at [employer].    Reduced risperidone from 1 to 1/2 mg Hs did reduce sleepiness.   No worsening of anxiety after the change.  Still has anxiety at times, no worse nor better.  No depression.  Sleep at least 8 hours. In bed 10 + hours bc retired.   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  Has retired.  Past Psychiatric Medication Trials: Sertraline diarrhea, fluvoxamine sedation, fluoxetine cognitive side effects, paroxetine side effects, risperidone 1 mg, Xanax Duloxetine 60 mg since September 2019  Review of Systems:  Review of Systems  Cardiovascular: Positive for palpitations.  Neurological: Negative for tremors and weakness.  Psychiatric/Behavioral: Positive for sleep disturbance. Negative for agitation, behavioral problems, confusion, decreased concentration, dysphoric mood, hallucinations, self-injury and suicidal ideas. The patient is nervous/anxious. The patient is not hyperactive.     Medications: I have reviewed the patient's current medications.  Current Outpatient Medications   Medication Sig Dispense Refill  . ALPRAZolam (XANAX) 0.5 MG tablet 1/2 tablet 3 times daily and 1 at bedtime 225 tablet 1  . aspirin-acetaminophen-caffeine (EXCEDRIN MIGRAINE) 250-250-65 MG tablet Take 1 tablet by mouth every 6 (six) hours as needed for headache.    . DULoxetine (CYMBALTA) 60 MG capsule Take 1 capsule (60 mg total) by mouth daily. 90 capsule 1  . irbesartan (AVAPRO) 300 MG tablet Take 300 mg by mouth daily.    . metoprolol tartrate (LOPRESSOR) 25 MG tablet Take 50 mg by mouth 2 (two) times daily.     . risperiDONE (RISPERDAL) 1 MG tablet Take 1 tablet (1 mg total) by mouth at bedtime. 90 tablet 1  . tadalafil (CIALIS) 5 MG tablet Take 5 mg by mouth daily as needed for erectile dysfunction.     No current facility-administered medications for this visit.    Medication Side Effects: None  Allergies:  Allergies  Allergen Reactions  . Sulfa Antibiotics Hives    Past Medical History:  Diagnosis Date  . ED (erectile dysfunction)   . GERD (gastroesophageal reflux disease)   . Hypertension   . Insomnia     Family History  Problem Relation Age of Onset  . Ulcers Mother   . Heart attack Father     Social History   Socioeconomic History  . Marital status: Married    Spouse name: Not on file  . Number of children: Not on file  . Years of education: Not on file  . Highest education  level: Not on file  Occupational History  . Not on file  Tobacco Use  . Smoking status: Never Smoker  . Smokeless tobacco: Never Used  Substance and Sexual Activity  . Alcohol use: No  . Drug use: No  . Sexual activity: Not on file  Other Topics Concern  . Not on file  Social History Narrative  . Not on file   Social Determinants of Health   Financial Resource Strain:   . Difficulty of Paying Living Expenses: Not on file  Food Insecurity:   . Worried About Charity fundraiser in the Last Year: Not on file  . Ran Out of Food in the Last Year: Not on file  Transportation  Needs:   . Lack of Transportation (Medical): Not on file  . Lack of Transportation (Non-Medical): Not on file  Physical Activity:   . Days of Exercise per Week: Not on file  . Minutes of Exercise per Session: Not on file  Stress:   . Feeling of Stress : Not on file  Social Connections:   . Frequency of Communication with Friends and Family: Not on file  . Frequency of Social Gatherings with Friends and Family: Not on file  . Attends Religious Services: Not on file  . Active Member of Clubs or Organizations: Not on file  . Attends Archivist Meetings: Not on file  . Marital Status: Not on file  Intimate Partner Violence:   . Fear of Current or Ex-Partner: Not on file  . Emotionally Abused: Not on file  . Physically Abused: Not on file  . Sexually Abused: Not on file    Past Medical History, Surgical history, Social history, and Family history were reviewed and updated as appropriate.   Please see review of systems for further details on the patient's review from today.   Objective:   Physical Exam:  There were no vitals taken for this visit.  Physical Exam Constitutional:      General: He is not in acute distress.    Appearance: Normal appearance. He is well-developed.  Musculoskeletal:        General: No deformity.  Neurological:     Mental Status: He is alert and oriented to person, place, and time.     Motor: No tremor.     Coordination: Coordination normal.     Gait: Gait normal.  Psychiatric:        Attention and Perception: Attention and perception normal.        Mood and Affect: Mood is anxious. Mood is not depressed. Affect is blunt. Affect is not labile, angry or inappropriate.        Speech: Speech is not delayed.        Behavior: Behavior is not slowed.        Thought Content: Thought content normal. Thought content does not include homicidal or suicidal ideation. Thought content does not include homicidal or suicidal plan.        Cognition and  Memory: Cognition normal.        Judgment: Judgment normal.     Comments: Insight intact. No auditory or visual hallucinations. No delusions. Chronic scrupulosity obsessions but under good control Chronic mild slowness predates risperidone and not marked.       Lab Review:     Component Value Date/Time   NA 137 03/28/2017 0820   K 3.2 (L) 03/28/2017 0820   CL 105 03/28/2017 0820   CO2 26 03/28/2017 0820   GLUCOSE  115 (H) 03/28/2017 0820   BUN 9 03/28/2017 0820   CREATININE 0.90 03/28/2017 0820   CALCIUM 8.8 (L) 03/28/2017 0820   PROT 7.2 03/08/2014 2005   ALBUMIN 3.9 03/08/2014 2005   AST 17 03/08/2014 2005   ALT 16 03/08/2014 2005   ALKPHOS 59 03/08/2014 2005   BILITOT 0.3 03/08/2014 2005   GFRNONAA >60 03/28/2017 0820   GFRAA >60 03/28/2017 0820       Component Value Date/Time   WBC 6.4 03/28/2017 0820   RBC 4.71 03/28/2017 0820   HGB 13.6 03/28/2017 0820   HCT 39.6 03/28/2017 0820   PLT 242 03/28/2017 0820   MCV 84.1 03/28/2017 0820   MCH 28.9 03/28/2017 0820   MCHC 34.3 03/28/2017 0820   RDW 13.2 03/28/2017 0820   LYMPHSABS 2.5 03/08/2014 2005   MONOABS 0.7 03/08/2014 2005   EOSABS 0.2 03/08/2014 2005   BASOSABS 0.0 03/08/2014 2005    No results found for: POCLITH, LITHIUM   No results found for: PHENYTOIN, PHENOBARB, VALPROATE, CBMZ   .res Assessment: Plan:    Matei was seen today for follow-up, other and medication problem.  Diagnoses and all orders for this visit:  Obsessive-compulsive disorder with good or fair insight -     DULoxetine (CYMBALTA) 60 MG capsule; Take 1 capsule (60 mg total) by mouth daily. -     risperiDONE (RISPERDAL) 1 MG tablet; Take 1 tablet (1 mg total) by mouth at bedtime. -     ALPRAZolam (XANAX) 0.5 MG tablet; 1/2 tablet 3 times daily and 1 at bedtime  Social anxiety disorder -     DULoxetine (CYMBALTA) 60 MG capsule; Take 1 capsule (60 mg total) by mouth daily. -     ALPRAZolam (XANAX) 0.5 MG tablet; 1/2 tablet 3 times  daily and 1 at bedtime  Insomnia due to mental condition -     ALPRAZolam (XANAX) 0.5 MG tablet; 1/2 tablet 3 times daily and 1 at bedtime  Medication sensitive.  Disc usual course and response of OCD with meds and unlikely to fully resolve and need for chronic treatment.  Also usually need max dosages of SSRI but he's med sensitive. Importance of activity with retirement otherwise anxiety will be worse.  Option DC risperidone.  He's fearful of anxiety and insomnia getting worse.  He needs the alprazolam still.    Encourage activiity .  He stays busy with volunteering at church and other places.  Disc importance activity to keep mental health.    We discussed the short-term risks associated with benzodiazepines including sedation and increased fall risk among others.  Discussed long-term side effect risk including dependence, potential withdrawal symptoms, and the potential eventual dose-related risk of dementia. No evidence for TD.  He doesn't want further med changes.  Fu 6 mos  Lynder Parents, MD, DFAPA    Please see After Visit Summary for patient specific instructions.  No future appointments.  No orders of the defined types were placed in this encounter.     -------------------------------

## 2019-10-04 ENCOUNTER — Other Ambulatory Visit: Payer: Self-pay | Admitting: Psychiatry

## 2019-10-04 DIAGNOSIS — F429 Obsessive-compulsive disorder, unspecified: Secondary | ICD-10-CM

## 2019-10-04 DIAGNOSIS — F4010 Social phobia, unspecified: Secondary | ICD-10-CM

## 2020-02-12 ENCOUNTER — Ambulatory Visit: Payer: 59 | Admitting: Psychiatry

## 2020-03-04 ENCOUNTER — Other Ambulatory Visit: Payer: Self-pay | Admitting: Psychiatry

## 2020-03-04 DIAGNOSIS — F429 Obsessive-compulsive disorder, unspecified: Secondary | ICD-10-CM

## 2020-03-04 DIAGNOSIS — F4010 Social phobia, unspecified: Secondary | ICD-10-CM

## 2020-03-04 DIAGNOSIS — F5105 Insomnia due to other mental disorder: Secondary | ICD-10-CM

## 2020-04-16 ENCOUNTER — Encounter: Payer: Self-pay | Admitting: Psychiatry

## 2020-04-16 ENCOUNTER — Other Ambulatory Visit: Payer: Self-pay

## 2020-04-16 ENCOUNTER — Ambulatory Visit (INDEPENDENT_AMBULATORY_CARE_PROVIDER_SITE_OTHER): Payer: 59 | Admitting: Psychiatry

## 2020-04-16 DIAGNOSIS — F5105 Insomnia due to other mental disorder: Secondary | ICD-10-CM | POA: Diagnosis not present

## 2020-04-16 DIAGNOSIS — F4010 Social phobia, unspecified: Secondary | ICD-10-CM | POA: Diagnosis not present

## 2020-04-16 DIAGNOSIS — F429 Obsessive-compulsive disorder, unspecified: Secondary | ICD-10-CM

## 2020-04-16 MED ORDER — ALPRAZOLAM 0.5 MG PO TABS: ORAL_TABLET | ORAL | 5 refills | Status: DC

## 2020-04-16 MED ORDER — DULOXETINE HCL 60 MG PO CPEP: 60.0000 mg | ORAL_CAPSULE | Freq: Every day | ORAL | 1 refills | Status: DC

## 2020-04-16 NOTE — Progress Notes (Signed)
Bryan Sosa [id number] 05-30-1956 64 y.o.  Subjective:   Patient ID:  Bryan Sosa is a 64 y.o. (DOB 1956-01-28) male.  Chief Complaint:  Chief Complaint  Patient presents with  . Follow-up  . Anxiety    HPI [name] presents to the office today for follow-up of OCD.  Last seen in december 2020.  No meds were changed.  04/16/20 appt with following noted: Still on meds including Xanax 0.5 mg tab 1/2 tab TID and 1 tab HS, risperidone 1/2 mg HS and duloxetine 60 mg daily. About the same.  OK I guess and not worse.  Anxious from time to time. Wonders if meds cause bad dreams, but more often vivid dreams.  No problems with the meds.  Consistent with meds.  Usually sleep OK. OCD under good enough control.  No longer problems with sleepiness.  Anxiety is good overall.  No sig problems with intrusive or obsessive thoughts now with the medicines.  Satisfied with meds.  Still dreams of working at [employer].  Helped to get OOB earlier with reduction Risperidone to 0.5 mg HS.  Likes having the 1 mg tablets.  Consistent with psych meds.  OCD is managed mostly.    Sporadically working at [employer].  Gained 20 # but inactive more.  Reduced risperidone from 1 to 1/2 mg Hs did reduce sleepiness.   No worsening of anxiety after the change.  Still has anxiety at times, no worse nor better.  No depression.  Sleep at least 8 hours. In bed 10 + hours bc retired.   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  Has retired.  Past Psychiatric Medication Trials: Sertraline diarrhea, fluvoxamine sedation, fluoxetine cognitive side effects, paroxetine side effects, risperidone 1 mg, Xanax Duloxetine 60 mg since September 2019  Review of Systems:  Review of Systems  Constitutional: Positive for unexpected weight change.  Cardiovascular: Positive for palpitations.  Neurological: Negative for tremors and weakness.  Psychiatric/Behavioral: Positive for sleep disturbance. Negative for agitation,  behavioral problems, confusion, decreased concentration, dysphoric mood, hallucinations, self-injury and suicidal ideas. The patient is nervous/anxious. The patient is not hyperactive.     Medications: I have reviewed the patient's current medications.  Current Outpatient Medications  Medication Sig Dispense Refill  . ALPRAZolam (XANAX) 0.5 MG tablet TAKE 1/2 TABLET BY MOUTH 3 TIMES DAILY AND 1 AT BEDTIME 75 tablet 5  . aspirin-acetaminophen-caffeine (EXCEDRIN MIGRAINE) 250-250-65 MG tablet Take 1 tablet by mouth every 6 (six) hours as needed for headache.    . DULoxetine (CYMBALTA) 60 MG capsule Take 1 capsule (60 mg total) by mouth daily. 90 capsule 1  . irbesartan (AVAPRO) 300 MG tablet Take 300 mg by mouth daily.    . metoprolol tartrate (LOPRESSOR) 25 MG tablet Take 50 mg by mouth 2 (two) times daily.     . risperiDONE (RISPERDAL) 1 MG tablet TAKE 1 TABLET BY MOUTH AT BEDTIME (Patient taking differently: Take 0.5 mg by mouth at bedtime. ) 90 tablet 1  . tadalafil (CIALIS) 5 MG tablet Take 5 mg by mouth daily as needed for erectile dysfunction.     No current facility-administered medications for this visit.    Medication Side Effects: None  Allergies:  Allergies  Allergen Reactions  . Sulfa Antibiotics Hives    Past Medical History:  Diagnosis Date  . ED (erectile dysfunction)   . GERD (gastroesophageal reflux disease)   . Hypertension   . Insomnia     Family History  Problem Relation Age of Onset  . Ulcers Mother   . Heart attack Father     Social History   Socioeconomic History  . Marital status: Married    Spouse name: Not on file  . Number of children: Not on file  . Years of education: Not on file  . Highest education level: Not on file  Occupational History  . Not on file  Tobacco Use  . Smoking status: Never Smoker  . Smokeless tobacco: Never Used  Substance and Sexual Activity  . Alcohol use: No  . Drug use: No  . Sexual activity: Not on file   Other Topics Concern  . Not on file  Social History Narrative  . Not on file   Social Determinants of Health   Financial Resource Strain:   . Difficulty of Paying Living Expenses: Not on file  Food Insecurity:   . Worried About Charity fundraiser in the Last Year: Not on file  . Ran Out of Food in the Last Year: Not on file  Transportation Needs:   . Lack of Transportation (Medical): Not on file  . Lack of Transportation (Non-Medical): Not on file  Physical Activity:   . Days of Exercise per Week: Not on file  . Minutes of Exercise per Session: Not on file  Stress:   . Feeling of Stress : Not on file  Social Connections:   . Frequency of Communication with Friends and Family: Not on file  . Frequency of Social Gatherings with Friends and Family: Not on file  . Attends Religious Services: Not on file  . Active Member of Clubs or Organizations: Not on file  . Attends Archivist Meetings: Not on file  . Marital Status: Not on file  Intimate Partner Violence:   . Fear of Current or Ex-Partner: Not on file  . Emotionally Abused: Not on file  . Physically Abused: Not on file  . Sexually Abused: Not on file    Past Medical History, Surgical history, Social history, and Family history were reviewed and updated as appropriate.   Please see review of systems for further details on the patient's review from today.   Objective:   Physical Exam:  There were no vitals taken for this visit.  Physical Exam Constitutional:      General: He is not in acute distress.    Appearance: Normal appearance. He is well-developed.  Musculoskeletal:        General: No deformity.  Neurological:     Mental Status: He is alert and oriented to person, place, and time.     Motor: No tremor.     Coordination: Coordination normal.     Gait: Gait normal.  Psychiatric:        Attention and Perception: Attention and perception normal.        Mood and Affect: Mood is anxious. Mood is not  depressed. Affect is blunt. Affect is not labile, angry or inappropriate.        Speech: Speech is not delayed.        Behavior: Behavior is not slowed.        Thought Content: Thought content normal. Thought content does not include homicidal or suicidal ideation. Thought content does not include homicidal or suicidal plan.        Cognition and Memory: Cognition normal.        Judgment: Judgment normal.     Comments: Insight intact. No auditory or visual hallucinations. No delusions.  Chronic scrupulosity obsessions but under good control Chronic mild slowness predates risperidone and not marked.       Lab Review:     Component Value Date/Time   NA 137 03/28/2017 0820   K 3.2 (L) 03/28/2017 0820   CL 105 03/28/2017 0820   CO2 26 03/28/2017 0820   GLUCOSE 115 (H) 03/28/2017 0820   BUN 9 03/28/2017 0820   CREATININE 0.90 03/28/2017 0820   CALCIUM 8.8 (L) 03/28/2017 0820   PROT 7.2 03/08/2014 2005   ALBUMIN 3.9 03/08/2014 2005   AST 17 03/08/2014 2005   ALT 16 03/08/2014 2005   ALKPHOS 59 03/08/2014 2005   BILITOT 0.3 03/08/2014 2005   GFRNONAA >60 03/28/2017 0820   GFRAA >60 03/28/2017 0820       Component Value Date/Time   WBC 6.4 03/28/2017 0820   RBC 4.71 03/28/2017 0820   HGB 13.6 03/28/2017 0820   HCT 39.6 03/28/2017 0820   PLT 242 03/28/2017 0820   MCV 84.1 03/28/2017 0820   MCH 28.9 03/28/2017 0820   MCHC 34.3 03/28/2017 0820   RDW 13.2 03/28/2017 0820   LYMPHSABS 2.5 03/08/2014 2005   MONOABS 0.7 03/08/2014 2005   EOSABS 0.2 03/08/2014 2005   BASOSABS 0.0 03/08/2014 2005    No results found for: POCLITH, LITHIUM   No results found for: PHENYTOIN, PHENOBARB, VALPROATE, CBMZ   .res Assessment: Plan:    Adolph was seen today for follow-up and anxiety.  Diagnoses and all orders for this visit:  Obsessive-compulsive disorder with good or fair insight -     DULoxetine (CYMBALTA) 60 MG capsule; Take 1 capsule (60 mg total) by mouth daily. -     ALPRAZolam  (XANAX) 0.5 MG tablet; TAKE 1/2 TABLET BY MOUTH 3 TIMES DAILY AND 1 AT BEDTIME  Social anxiety disorder -     DULoxetine (CYMBALTA) 60 MG capsule; Take 1 capsule (60 mg total) by mouth daily. -     ALPRAZolam (XANAX) 0.5 MG tablet; TAKE 1/2 TABLET BY MOUTH 3 TIMES DAILY AND 1 AT BEDTIME  Insomnia due to mental condition -     ALPRAZolam (XANAX) 0.5 MG tablet; TAKE 1/2 TABLET BY MOUTH 3 TIMES DAILY AND 1 AT BEDTIME  Medication sensitive.  Disc usual course and response of OCD with meds and unlikely to fully resolve and need for chronic treatment.  Also usually need max dosages of SSRI but he's med sensitive. Importance of activity with retirement otherwise anxiety will be worse.  Option DC risperidone.  He's fearful of anxiety and insomnia getting worse.  May possibly contribute to weight gain. He might consider stopping.  He needs the alprazolam still.    Encourage activiity .  He stays busy with volunteering at church and other places.  Disc importance activity to keep mental health.    Watch caffeine and sleep.  We discussed the short-term risks associated with benzodiazepines including sedation and increased fall risk among others.  Discussed long-term side effect risk including dependence, potential withdrawal symptoms, and the potential eventual dose-related risk of dementia. No evidence for TD.  He doesn't want further med changes.  Fu 6 mos  Lynder Parents, MD, DFAPA    Please see After Visit Summary for patient specific instructions.  No future appointments.  No orders of the defined types were placed in this encounter.     -------------------------------

## 2020-05-26 ENCOUNTER — Other Ambulatory Visit (HOSPITAL_COMMUNITY): Payer: Self-pay | Admitting: Urology

## 2020-05-26 DIAGNOSIS — D4410 Neoplasm of uncertain behavior of unspecified adrenal gland: Secondary | ICD-10-CM

## 2020-05-29 ENCOUNTER — Other Ambulatory Visit: Payer: Self-pay | Admitting: Psychiatry

## 2020-05-29 DIAGNOSIS — F429 Obsessive-compulsive disorder, unspecified: Secondary | ICD-10-CM

## 2020-05-29 DIAGNOSIS — F5105 Insomnia due to other mental disorder: Secondary | ICD-10-CM

## 2020-05-29 DIAGNOSIS — F4010 Social phobia, unspecified: Secondary | ICD-10-CM

## 2020-06-01 ENCOUNTER — Other Ambulatory Visit: Payer: Self-pay

## 2020-06-01 ENCOUNTER — Ambulatory Visit (HOSPITAL_COMMUNITY)
Admission: RE | Admit: 2020-06-01 | Discharge: 2020-06-01 | Disposition: A | Discharge status: Home / Self Care | Payer: 59 | Source: Ambulatory Visit | Attending: Urology | Admitting: Urology

## 2020-06-01 DIAGNOSIS — D4410 Neoplasm of uncertain behavior of unspecified adrenal gland: Secondary | ICD-10-CM | POA: Insufficient documentation

## 2020-06-01 MED ORDER — GADOBUTROL 1 MMOL/ML IV SOLN
10.0000 mL | Freq: Once | INTRAVENOUS | Status: AC | PRN
  Administered 2020-06-01: 10 mL via INTRAVENOUS

## 2020-06-01 NOTE — Telephone Encounter (Signed)
Refills? Not due until 09/2020

## 2020-08-31 ENCOUNTER — Other Ambulatory Visit: Payer: Self-pay | Admitting: Psychiatry

## 2020-08-31 DIAGNOSIS — F4010 Social phobia, unspecified: Secondary | ICD-10-CM

## 2020-08-31 DIAGNOSIS — F429 Obsessive-compulsive disorder, unspecified: Secondary | ICD-10-CM

## 2020-10-15 ENCOUNTER — Encounter: Payer: Self-pay | Admitting: Psychiatry

## 2020-10-15 ENCOUNTER — Ambulatory Visit (INDEPENDENT_AMBULATORY_CARE_PROVIDER_SITE_OTHER): Payer: 59 | Admitting: Psychiatry

## 2020-10-15 ENCOUNTER — Other Ambulatory Visit: Payer: Self-pay

## 2020-10-15 DIAGNOSIS — F429 Obsessive-compulsive disorder, unspecified: Secondary | ICD-10-CM

## 2020-10-15 DIAGNOSIS — F4010 Social phobia, unspecified: Secondary | ICD-10-CM | POA: Diagnosis not present

## 2020-10-15 DIAGNOSIS — F5105 Insomnia due to other mental disorder: Secondary | ICD-10-CM | POA: Diagnosis not present

## 2020-10-15 MED ORDER — ALPRAZOLAM 0.5 MG PO TABS: ORAL_TABLET | ORAL | 5 refills | Status: DC

## 2020-10-15 MED ORDER — DULOXETINE HCL 60 MG PO CPEP: 60.0000 mg | ORAL_CAPSULE | Freq: Every day | ORAL | 1 refills | Status: DC

## 2020-10-15 NOTE — Progress Notes (Signed)
Bryan Sosa [id number] 29-Jul-1956 65 y.o.  Subjective:   Patient ID:  Bryan Sosa is a 65 y.o. (DOB 1955-11-20) male.  Chief Complaint:  Chief Complaint  Patient presents with  . Follow-up  . Obsessive-compulsive disorder with good or fair insight  . Depression  . Anxiety    HPI Bryan Sosa presents to the office today for follow-up of OCD.  Last seen in december 2020.  No meds were changed.  04/16/20 appt with following noted: Still on meds including Xanax 0.5 mg tab 1/2 tab TID and 1 tab HS, risperidone 1/2 mg HS and duloxetine 60 mg daily. About the same.  OK I guess and not worse.  Anxious from time to time. Wonders if meds cause bad dreams, but more often vivid dreams.  No problems with the meds.  Consistent with meds.  Usually sleep OK. OCD under good enough control. No longer problems with sleepiness.  Anxiety is good overall.  No sig problems with intrusive or obsessive thoughts now with the medicines.  Satisfied with meds.  Still dreams of working at [employer].  Helped to get OOB earlier with reduction Risperidone to 0.5 mg HS.  Likes having the 1 mg tablets.  Consistent with psych meds.  OCD is managed mostly.   Sporadically working at [employer].  Gained 20 # but inactive more. Reduced risperidone from 1 to 1/2 mg Hs did reduce sleepiness.   No worsening of anxiety after the change.  Still has anxiety at times, no worse nor better.  No depression.  Sleep at least 8 hours. In bed 10 + hours bc retired.   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  Has retired. Plan: No further med changes  10/15/2020 appointment with the following noted: He stopped risperidone about 03/2020 without difficulty except a little trouble with sleep awakening.   Asked Dr. Mariea Clonts about this and excessive sleepiness. Taking alprazolam 0.25 mg TID and 0.5 mg HS.Marland Kitchen They are wondering about OSA.   To bed 1230 and up 930.  Toss and turn.  Wife says he snores.  He plans to go back there if  continues with this problem. Doing fine.  Joined gym 2 hours day and 5 days per week helping mood. Not sure about depression.  Residual anxiety and would like to continue meds. No avoidance as far as he knows. Weight about 241#.  Questions about meds and weight gain.   Past Psychiatric Medication Trials: Sertraline diarrhea, fluvoxamine sedation, fluoxetine cognitive side effects, paroxetine side effects,  risperidone 1 mg,  Xanax Duloxetine 60 mg since September 2019  Review of Systems:  Review of Systems  Constitutional: Positive for unexpected weight change.  Cardiovascular: Negative for palpitations.  Neurological: Negative for tremors and weakness.  Psychiatric/Behavioral: Positive for sleep disturbance. Negative for agitation, behavioral problems, confusion, decreased concentration, dysphoric mood, hallucinations, self-injury and suicidal ideas. The patient is nervous/anxious. The patient is not hyperactive.     Medications: I have reviewed the patient's current medications.  Current Outpatient Medications  Medication Sig Dispense Refill  . aspirin-acetaminophen-caffeine (EXCEDRIN MIGRAINE) 250-250-65 MG tablet Take 1 tablet by mouth every 6 (six) hours as needed for headache.    . irbesartan (AVAPRO) 300 MG tablet Take 300 mg by mouth daily.    . metoprolol tartrate (LOPRESSOR) 25 MG tablet Take 50 mg by mouth 2 (two) times daily.     . sildenafil (VIAGRA) 100 MG tablet Take 100 mg by mouth daily as needed for  erectile dysfunction.    Marland Kitchen ALPRAZolam (XANAX) 0.5 MG tablet TAKE 1/2 TABLET BY MOUTH 3 TIMES DAILY AND 1 AT BEDTIME 75 tablet 5  . DULoxetine (CYMBALTA) 60 MG capsule Take 1 capsule (60 mg total) by mouth daily. 90 capsule 1  . tadalafil (CIALIS) 5 MG tablet Take 5 mg by mouth daily as needed for erectile dysfunction. (Patient not taking: Reported on 10/15/2020)     No current facility-administered medications for this visit.    Medication Side Effects:  None  Allergies:  Allergies  Allergen Reactions  . Sulfa Antibiotics Hives    Past Medical History:  Diagnosis Date  . ED (erectile dysfunction)   . GERD (gastroesophageal reflux disease)   . Hypertension   . Insomnia     Family History  Problem Relation Age of Onset  . Ulcers Mother   . Heart attack Father     Social History   Socioeconomic History  . Marital status: Married    Spouse name: Not on file  . Number of children: Not on file  . Years of education: Not on file  . Highest education level: Not on file  Occupational History  . Not on file  Tobacco Use  . Smoking status: Never Smoker  . Smokeless tobacco: Never Used  Substance and Sexual Activity  . Alcohol use: No  . Drug use: No  . Sexual activity: Not on file  Other Topics Concern  . Not on file  Social History Narrative  . Not on file   Social Determinants of Health   Financial Resource Strain: Not on file  Food Insecurity: Not on file  Transportation Needs: Not on file  Physical Activity: Not on file  Stress: Not on file  Social Connections: Not on file  Intimate Partner Violence: Not on file    Past Medical History, Surgical history, Social history, and Family history were reviewed and updated as appropriate.   Please see review of systems for further details on the patient's review from today.   Objective:   Physical Exam:  There were no vitals taken for this visit.  Physical Exam Constitutional:      General: He is not in acute distress.    Appearance: Normal appearance. He is well-developed.  Musculoskeletal:        General: No deformity.  Neurological:     Mental Status: He is alert and oriented to person, place, and time.     Motor: No tremor.     Coordination: Coordination normal.     Gait: Gait normal.  Psychiatric:        Attention and Perception: Attention and perception normal.        Mood and Affect: Mood is anxious. Mood is not depressed. Affect is blunt. Affect is  not labile, angry or inappropriate.        Speech: Speech is not delayed.        Behavior: Behavior is not slowed.        Thought Content: Thought content normal. Thought content does not include homicidal or suicidal ideation. Thought content does not include homicidal or suicidal plan.        Cognition and Memory: Cognition normal.        Judgment: Judgment normal.     Comments: Insight intact. No auditory or visual hallucinations. No delusions. Chronic scrupulosity obsessions but under good control Chronic mild slowness      Lab Review:     Component Value Date/Time   NA 137  03/28/2017 0820   K 3.2 (L) 03/28/2017 0820   CL 105 03/28/2017 0820   CO2 26 03/28/2017 0820   GLUCOSE 115 (H) 03/28/2017 0820   BUN 9 03/28/2017 0820   CREATININE 0.90 03/28/2017 0820   CALCIUM 8.8 (L) 03/28/2017 0820   PROT 7.2 03/08/2014 2005   ALBUMIN 3.9 03/08/2014 2005   AST 17 03/08/2014 2005   ALT 16 03/08/2014 2005   ALKPHOS 59 03/08/2014 2005   BILITOT 0.3 03/08/2014 2005   GFRNONAA >60 03/28/2017 0820   GFRAA >60 03/28/2017 0820       Component Value Date/Time   WBC 6.4 03/28/2017 0820   RBC 4.71 03/28/2017 0820   HGB 13.6 03/28/2017 0820   HCT 39.6 03/28/2017 0820   PLT 242 03/28/2017 0820   MCV 84.1 03/28/2017 0820   MCH 28.9 03/28/2017 0820   MCHC 34.3 03/28/2017 0820   RDW 13.2 03/28/2017 0820   LYMPHSABS 2.5 03/08/2014 2005   MONOABS 0.7 03/08/2014 2005   EOSABS 0.2 03/08/2014 2005   BASOSABS 0.0 03/08/2014 2005    No results found for: POCLITH, LITHIUM   No results found for: PHENYTOIN, PHENOBARB, VALPROATE, CBMZ   .res Assessment: Plan:    Bryan Sosa was seen today for follow-up, obsessive-compulsive disorder with good or fair insight, depression and anxiety.  Diagnoses and all orders for this visit:  Obsessive-compulsive disorder with good or fair insight -     ALPRAZolam (XANAX) 0.5 MG tablet; TAKE 1/2 TABLET BY MOUTH 3 TIMES DAILY AND 1 AT BEDTIME -      DULoxetine (CYMBALTA) 60 MG capsule; Take 1 capsule (60 mg total) by mouth daily.  Social anxiety disorder -     ALPRAZolam (XANAX) 0.5 MG tablet; TAKE 1/2 TABLET BY MOUTH 3 TIMES DAILY AND 1 AT BEDTIME -     DULoxetine (CYMBALTA) 60 MG capsule; Take 1 capsule (60 mg total) by mouth daily.  Insomnia due to mental condition -     ALPRAZolam (XANAX) 0.5 MG tablet; TAKE 1/2 TABLET BY MOUTH 3 TIMES DAILY AND 1 AT BEDTIME  Medication sensitive.  Disc usual course and response of OCD with meds and unlikely to fully resolve and need for chronic treatment.  Also usually need max dosages of SSRI but he's med sensitive. Importance of activity with retirement otherwise anxiety will be worse.  He stopped risperidone without a noticeable problem.    He needs the alprazolam still.  Disc option wean gradually.  Encourage activiity .  He stays busy with volunteering at church and other places.  Disc importance activity to keep mental health.    Watch caffeine and sleep.  We discussed the short-term risks associated with benzodiazepines including sedation and increased fall risk among others.  Discussed long-term side effect risk including dependence, potential withdrawal symptoms, and the potential eventual dose-related risk of dementia. No evidence for TD.  He doesn't want further med changes.  Fu 6 mos  Lynder Parents, MD, DFAPA    Please see After Visit Summary for patient specific instructions.  No future appointments.  No orders of the defined types were placed in this encounter.     -------------------------------

## 2020-12-17 DIAGNOSIS — H90A32 Mixed conductive and sensorineural hearing loss, unilateral, left ear with restricted hearing on the contralateral side: Secondary | ICD-10-CM

## 2020-12-17 DIAGNOSIS — H938X2 Other specified disorders of left ear: Secondary | ICD-10-CM | POA: Insufficient documentation

## 2020-12-17 HISTORY — DX: Mixed conductive and sensorineural hearing loss, unilateral, left ear with restricted hearing on the contralateral side: H90.A32

## 2021-01-08 DIAGNOSIS — H6992 Unspecified Eustachian tube disorder, left ear: Secondary | ICD-10-CM

## 2021-01-08 HISTORY — DX: Unspecified Eustachian tube disorder, left ear: H69.92

## 2021-04-15 ENCOUNTER — Ambulatory Visit: Payer: 59 | Admitting: Psychiatry

## 2021-04-23 ENCOUNTER — Other Ambulatory Visit: Payer: Self-pay | Admitting: Psychiatry

## 2021-04-23 DIAGNOSIS — F5105 Insomnia due to other mental disorder: Secondary | ICD-10-CM

## 2021-04-23 DIAGNOSIS — F4010 Social phobia, unspecified: Secondary | ICD-10-CM

## 2021-04-23 DIAGNOSIS — F429 Obsessive-compulsive disorder, unspecified: Secondary | ICD-10-CM

## 2021-04-23 NOTE — Telephone Encounter (Signed)
Please schedule appt

## 2021-04-26 NOTE — Telephone Encounter (Signed)
Pt has an appt 11/2

## 2021-04-27 NOTE — Telephone Encounter (Signed)
Last filled 7/30 appt on 11/2

## 2021-05-15 ENCOUNTER — Other Ambulatory Visit: Payer: Self-pay | Admitting: Psychiatry

## 2021-05-25 ENCOUNTER — Other Ambulatory Visit: Payer: Self-pay | Admitting: Urology

## 2021-05-25 DIAGNOSIS — R972 Elevated prostate specific antigen [PSA]: Secondary | ICD-10-CM

## 2021-05-26 ENCOUNTER — Other Ambulatory Visit: Payer: Self-pay | Admitting: Psychiatry

## 2021-05-26 DIAGNOSIS — F4010 Social phobia, unspecified: Secondary | ICD-10-CM

## 2021-05-26 DIAGNOSIS — F429 Obsessive-compulsive disorder, unspecified: Secondary | ICD-10-CM

## 2021-06-18 ENCOUNTER — Ambulatory Visit
Admission: RE | Admit: 2021-06-18 | Discharge: 2021-06-18 | Disposition: A | Discharge status: Home / Self Care | Payer: 59 | Source: Ambulatory Visit | Attending: Urology | Admitting: Urology

## 2021-06-18 DIAGNOSIS — R972 Elevated prostate specific antigen [PSA]: Secondary | ICD-10-CM

## 2021-06-18 MED ORDER — GADOBENATE DIMEGLUMINE 529 MG/ML IV SOLN
20.0000 mL | Freq: Once | INTRAVENOUS | Status: AC | PRN
  Administered 2021-06-18: 20 mL via INTRAVENOUS

## 2021-06-30 ENCOUNTER — Other Ambulatory Visit: Payer: Self-pay

## 2021-06-30 ENCOUNTER — Encounter: Payer: Self-pay | Admitting: Psychiatry

## 2021-06-30 ENCOUNTER — Ambulatory Visit: Payer: 59 | Admitting: Psychiatry

## 2021-06-30 DIAGNOSIS — F4010 Social phobia, unspecified: Secondary | ICD-10-CM | POA: Diagnosis not present

## 2021-06-30 DIAGNOSIS — F5105 Insomnia due to other mental disorder: Secondary | ICD-10-CM | POA: Diagnosis not present

## 2021-06-30 DIAGNOSIS — F429 Obsessive-compulsive disorder, unspecified: Secondary | ICD-10-CM

## 2021-06-30 MED ORDER — RISPERIDONE 1 MG PO TABS: 1.0000 mg | ORAL_TABLET | Freq: Every day | ORAL | 1 refills | Status: DC

## 2021-06-30 MED ORDER — DULOXETINE HCL 60 MG PO CPEP: 60.0000 mg | ORAL_CAPSULE | Freq: Every day | ORAL | 2 refills | Status: DC

## 2021-06-30 MED ORDER — ALPRAZOLAM 0.5 MG PO TABS: ORAL_TABLET | ORAL | 5 refills | Status: DC

## 2021-06-30 NOTE — Progress Notes (Signed)
Bryan Sosa [id number] 1956/06/14 65 y.o.  Subjective:   Patient ID:  Bryan Sosa is a 65 y.o. (DOB 1956-01-23) male.  Chief Complaint:  Chief Complaint  Patient presents with   Follow-up   OCD   Anxiety   Sleeping Problem    HPI RAYNALD Sosa presents to the office today for follow-up of OCD.  seen in december 2020.  No meds were changed.  04/16/20 appt with following noted: Still on meds including Xanax 0.5 mg tab 1/2 tab TID and 1 tab HS, risperidone 1/2 mg HS and duloxetine 60 mg daily. About the same.  OK I guess and not worse.  Anxious from time to time. Wonders if meds cause bad dreams, but more often vivid dreams.  No problems with the meds.  Consistent with meds.  Usually sleep OK. OCD under good enough control. No longer problems with sleepiness.  Anxiety is good overall.  No sig problems with intrusive or obsessive thoughts now with the medicines.  Satisfied with meds.  Still dreams of working at [employer].  Helped to get OOB earlier with reduction Risperidone to 0.5 mg HS.  Likes having the 1 mg tablets.  Consistent with psych meds.  OCD is managed mostly.   Sporadically working at [employer].  Gained 20 # but inactive more. Reduced risperidone from 1 to 1/2 mg Hs did reduce sleepiness.   No worsening of anxiety after the change.  Still has anxiety at times, no worse nor better.  No depression.  Sleep at least 8 hours. In bed 10 + hours bc retired.   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  Has retired. Plan: No further med changes  10/15/2020 appointment with the following noted: He stopped risperidone about 03/2020 without difficulty except a little trouble with sleep awakening.   Asked Dr. Mariea Clonts about this and excessive sleepiness. Taking alprazolam 0.25 mg TID and 0.5 mg HS.Marland Kitchen They are wondering about OSA.   To bed 1230 and up 930.  Toss and turn.  Wife says he snores.  He plans to go back there if continues with this problem. Doing fine.  Joined gym 2  hours day and 5 days per week helping mood. Not sure about depression.  Residual anxiety and would like to continue meds. No avoidance as far as he knows. Weight about 241#.  Questions about meds and weight gain. Plan: No med changes. Continue alprazolam 0.5 mg half 3 times daily and 1 nightly prn, risperidone 1 mg nightly, duloxetine 60 mg daily.  06/30/2021 appointment with the following noted: Usually alprazolam 0.25 mg am and 0.5 mg HS. Good overall.  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  Pleased. Anxiety mostly OK.  Occ bouts are less often.  Time to time obs but nowhere near as bad as it was. Noticed positional tremor resting.   No sleep study. Went off risperidone for 10 days and starting having sleep problems so restarted it.   Not depressed.  Trouble staying asleep at times.  Might nap 2 days per week for 60-90 mins and other days 2 hour naps.  Then in bed 9 hours.  Doesn't think his sleep is that good.  May go to bed out of boredom. 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 D pregnant and due Dec with first grandchild.  Past Psychiatric Medication Trials: Sertraline diarrhea, fluvoxamine sedation, fluoxetine cognitive side effects, paroxetine side effects,  risperidone 1 mg,  Xanax Duloxetine 60 mg since September 2019  Review of Systems:  Review of Systems  Constitutional:  Negative for unexpected weight change.  Cardiovascular:  Negative for palpitations.  Neurological:  Positive for tremors. Negative for weakness.  Psychiatric/Behavioral:  Positive for sleep disturbance. Negative for agitation, behavioral problems, confusion, decreased concentration, dysphoric mood, hallucinations, self-injury and suicidal ideas. The patient is nervous/anxious. The patient is not hyperactive.    Medications: I have reviewed the patient's current medications.  Current Outpatient Medications  Medication Sig Dispense Refill   alfuzosin (UROXATRAL) 10 MG 24 hr tablet Take 10 mg by mouth  daily with breakfast.     amLODipine-atorvastatin (CADUET) 5-10 MG tablet Take 1 tablet by mouth daily.     aspirin-acetaminophen-caffeine (EXCEDRIN MIGRAINE) 250-250-65 MG tablet Take 1 tablet by mouth every 6 (six) hours as needed for headache.     irbesartan (AVAPRO) 300 MG tablet Take 300 mg by mouth daily.     metoprolol tartrate (LOPRESSOR) 25 MG tablet Take 50 mg by mouth 2 (two) times daily.      tadalafil (CIALIS) 5 MG tablet Take 5 mg by mouth daily as needed for erectile dysfunction.     ALPRAZolam (XANAX) 0.5 MG tablet TAKE 1/2 TABLET BY MOUTH THREE TIMES DAILY AND 1 AT BEDTIME 75 tablet 5   DULoxetine (CYMBALTA) 60 MG capsule Take 1 capsule (60 mg total) by mouth daily. 90 capsule 2   risperiDONE (RISPERDAL) 1 MG tablet Take 1 tablet (1 mg total) by mouth at bedtime. 90 tablet 1   No current facility-administered medications for this visit.    Medication Side Effects: None  Allergies:  Allergies  Allergen Reactions   Sulfa Antibiotics Hives    Past Medical History:  Diagnosis Date   ED (erectile dysfunction)    GERD (gastroesophageal reflux disease)    Hypertension    Insomnia     Family History  Problem Relation Age of Onset   Ulcers Mother    Heart attack Father     Social History   Socioeconomic History   Marital status: Married    Spouse name: Not on file   Number of children: Not on file   Years of education: Not on file   Highest education level: Not on file  Occupational History   Not on file  Tobacco Use   Smoking status: Never   Smokeless tobacco: Never  Substance and Sexual Activity   Alcohol use: No   Drug use: No   Sexual activity: Not on file  Other Topics Concern   Not on file  Social History Narrative   Not on file   Social Determinants of Health   Financial Resource Strain: Not on file  Food Insecurity: Not on file  Transportation Needs: Not on file  Physical Activity: Not on file  Stress: Not on file  Social Connections:  Not on file  Intimate Partner Violence: Not on file    Past Medical History, Surgical history, Social history, and Family history were reviewed and updated as appropriate.   Please see review of systems for further details on the patient's review from today.   Objective:   Physical Exam:  There were no vitals taken for this visit.  Physical Exam Constitutional:      General: He is not in acute distress.    Appearance: Normal appearance. He is well-developed.  Musculoskeletal:        General: No deformity.  Neurological:     Mental Status: He is alert and oriented to person, place, and time.  Motor: No tremor.     Coordination: Coordination normal.     Gait: Gait normal.  Psychiatric:        Attention and Perception: Attention and perception normal.        Mood and Affect: Mood is anxious. Mood is not depressed. Affect is blunt. Affect is not labile, angry or inappropriate.        Speech: Speech is not delayed.        Behavior: Behavior is not slowed.        Thought Content: Thought content normal. Thought content does not include homicidal or suicidal ideation. Thought content does not include homicidal or suicidal plan.        Cognition and Memory: Cognition normal.        Judgment: Judgment normal.     Comments: Insight intact. No auditory or visual hallucinations. No delusions. Chronic scrupulosity obsessions but under good control Chronic mild slowness     Lab Review:     Component Value Date/Time   NA 137 03/28/2017 0820   K 3.2 (L) 03/28/2017 0820   CL 105 03/28/2017 0820   CO2 26 03/28/2017 0820   GLUCOSE 115 (H) 03/28/2017 0820   BUN 9 03/28/2017 0820   CREATININE 0.90 03/28/2017 0820   CALCIUM 8.8 (L) 03/28/2017 0820   PROT 7.2 03/08/2014 2005   ALBUMIN 3.9 03/08/2014 2005   AST 17 03/08/2014 2005   ALT 16 03/08/2014 2005   ALKPHOS 59 03/08/2014 2005   BILITOT 0.3 03/08/2014 2005   GFRNONAA >60 03/28/2017 0820   GFRAA >60 03/28/2017 0820        Component Value Date/Time   WBC 6.4 03/28/2017 0820   RBC 4.71 03/28/2017 0820   HGB 13.6 03/28/2017 0820   HCT 39.6 03/28/2017 0820   PLT 242 03/28/2017 0820   MCV 84.1 03/28/2017 0820   MCH 28.9 03/28/2017 0820   MCHC 34.3 03/28/2017 0820   RDW 13.2 03/28/2017 0820   LYMPHSABS 2.5 03/08/2014 2005   MONOABS 0.7 03/08/2014 2005   EOSABS 0.2 03/08/2014 2005   BASOSABS 0.0 03/08/2014 2005    No results found for: POCLITH, LITHIUM   No results found for: PHENYTOIN, PHENOBARB, VALPROATE, CBMZ   .res Assessment: Plan:    Aquarius was seen today for follow-up, ocd, anxiety and sleeping problem.  Diagnoses and all orders for this visit:  Obsessive-compulsive disorder with good or fair insight -     ALPRAZolam (XANAX) 0.5 MG tablet; TAKE 1/2 TABLET BY MOUTH THREE TIMES DAILY AND 1 AT BEDTIME -     DULoxetine (CYMBALTA) 60 MG capsule; Take 1 capsule (60 mg total) by mouth daily. -     risperiDONE (RISPERDAL) 1 MG tablet; Take 1 tablet (1 mg total) by mouth at bedtime.  Social anxiety disorder -     ALPRAZolam (XANAX) 0.5 MG tablet; TAKE 1/2 TABLET BY MOUTH THREE TIMES DAILY AND 1 AT BEDTIME -     DULoxetine (CYMBALTA) 60 MG capsule; Take 1 capsule (60 mg total) by mouth daily. -     risperiDONE (RISPERDAL) 1 MG tablet; Take 1 tablet (1 mg total) by mouth at bedtime.  Insomnia due to mental condition -     ALPRAZolam (XANAX) 0.5 MG tablet; TAKE 1/2 TABLET BY MOUTH THREE TIMES DAILY AND 1 AT BEDTIME Medication sensitive.  Disc usual course and response of OCD, scrupulosity with meds and unlikely to fully resolve and need for chronic treatment.  Also usually need max dosages of SSRI but  he's med sensitive. Importance of activity with retirement otherwise anxiety might be worse but he's done OK with anxiety but limited activity.. Encourage activiity .  He stays busy with volunteering at church and other places.  He stopped and restarted risperidone.   Discussed potential metabolic  side effects associated with atypical antipsychotics, as well as potential risk for movement side effects. Advised pt to contact office if movement side effects occur.  No AIM  He needs the alprazolam still.  Disc option wean gradually.  Disc importance activity to keep mental health.    Watch caffeine and sleep.  Sleep hygiene.  Spending too much time in bed. Sleep restriction.  We discussed the short-term risks associated with benzodiazepines including sedation and increased fall risk among others.  Discussed long-term side effect risk including dependence, potential withdrawal symptoms, and the potential eventual dose-related risk of dementia. No evidence for TD.  He doesn't want further med changes. Continue duloxetine 60, risperidone 1, alprazolam prn. Option reduce risperidone to 0.5 bc occ tremor.  No tremor in office.  Fu 6 mos  Lynder Parents, MD, DFAPA    Please see After Visit Summary for patient specific instructions.  No future appointments.  No orders of the defined types were placed in this encounter.     -------------------------------

## 2021-08-10 ENCOUNTER — Other Ambulatory Visit (HOSPITAL_COMMUNITY): Payer: 59

## 2021-08-27 ENCOUNTER — Other Ambulatory Visit: Payer: Self-pay | Admitting: Urology

## 2021-09-30 NOTE — Patient Instructions (Signed)
DUE TO COVID-19 ONLY ONE VISITOR IS ALLOWED TO COME WITH YOU AND STAY IN THE WAITING ROOM ONLY DURING PRE OP AND PROCEDURE DAY OF SURGERY.   Up to two visitors ages 16+ are allowed at one time in a patient's room.  The visitors may rotate out with other people throughout the day.  Additionally, up to two children between the ages of 59 and 40 are allowed and do not count toward the number of allowed visitors.  Children within this age range must be accompanied by an adult visitor.  One adult visitor may remain with the patient overnight and must be in the room by 8 PM.  YOU NEED TO HAVE A COVID 19 TEST ON_2-13-23  @ ______ THIS TEST MUST BE DONE BEFORE SURGERY,     COVID TESTING [place] COME IN THROUGH MAIN ENTRANCE BE SEATED INT THE LOBBY AREA TO THE RIGHT AS YOU COME IN THE MAIN ENTRANCE DIAL [phone] GIVE THEM YOUR NAME AND LET THEM KNOW YOU ARE HERE FOR COVID TESTING    ONCE YOUR COVID TEST IS COMPLETED,  PLEASE Wear a mask when in [place]           Your procedure is scheduled on: 10-13-21   Report to [facility] Main  Entrance   Report to admitting at       [place] AM     Call this number if you have problems the morning of surgery [phone]   Remember: [place] PER MD [place] DIET   Foods Allowed                                                                     Foods Excluded  Coffee and tea, regular and decaf                             liquids that you cannot  Plain Jell-O any favor except red or purple                                           see through such as: Fruit ices (not with fruit pulp)                                     milk, soups, orange juice  Iced Popsicles                                    All solid food Carbonated beverages, regular and diet                                    Cranberry, grape and apple  juices Sports drinks like Gatorade Lightly seasoned clear broth or consume(fat free)  Sugar _____________________________________________________________________      BRUSH YOUR TEETH MORNING OF SURGERY AND RINSE YOUR MOUTH OUT, NO CHEWING GUM CANDY OR MINTS.     Take these medicines the morning of surgery with A SIP OF WATER: metoprolol, amlodipine, duloxentine, alfuzosin  DO NOT TAKE ANY DIABETIC MEDICATIONS DAY OF YOUR SURGERY                               You may not have any metal on your body including hair pins and              piercings  Do not wear jewelry,lotions, powders,perfumes,        deodorant                       Men may shave face and neck.   Do not bring valuables to the hospital. [place].  Contacts, dentures or bridgework may not be worn into surgery.  You may bring a small overnight bag with you     Patients discharged the day of surgery will not be allowed to drive home. IF YOU ARE HAVING SURGERY AND GOING HOME THE SAME DAY, YOU MUST HAVE AN ADULT TO DRIVE YOU HOME AND BE WITH YOU FOR 24 HOURS. YOU MAY GO HOME BY TAXI OR UBER OR ORTHERWISE, BUT AN ADULT MUST ACCOMPANY YOU HOME AND STAY WITH YOU FOR 24 HOURS.  Name and phone number of your driver:  Special Instructions: N/A              Please read over the following fact sheets you were given: _____________________________________________________________________             [facility] - Preparing for Surgery Before surgery, you can play an important role.  Because skin is not sterile, your skin needs to be as free of germs as possible.  You can reduce the number of germs on your skin by washing with CHG (chlorahexidine gluconate) soap before surgery.  CHG is an antiseptic cleaner which kills germs and bonds with the skin to continue killing germs even after washing. Please DO NOT use if you have an allergy to CHG or antibacterial soaps.  If your skin becomes  reddened/irritated stop using the CHG and inform your nurse when you arrive at Short Stay. Do not shave (including legs and underarms) for at least 48 hours prior to the first CHG shower.  You may shave your face/neck. Please follow these instructions carefully:  1.  Shower with CHG Soap the night before surgery and the  morning of Surgery.  2.  If you choose to wash your hair, wash your hair first as usual with your  normal  shampoo.  3.  After you shampoo, rinse your hair and body thoroughly to remove the  shampoo.                           4.  Use CHG as you would any other liquid soap.  You can apply chg directly  to the skin and wash                       Gently with a scrungie or clean washcloth.  5.  Apply the CHG Soap to your  body ONLY FROM THE NECK DOWN.   Do not use on face/ open                           Wound or open sores. Avoid contact with eyes, ears mouth and genitals (private parts).                       Wash face,  Genitals (private parts) with your normal soap.             6.  Wash thoroughly, paying special attention to the area where your surgery  will be performed.  7.  Thoroughly rinse your body with warm water from the neck down.  8.  DO NOT shower/wash with your normal soap after using and rinsing off  the CHG Soap.                9.  Pat yourself dry with a clean towel.            10.  Wear clean pajamas.            11.  Place clean sheets on your bed the night of your first shower and do not  sleep with pets. Day of Surgery : Do not apply any lotions/deodorants the morning of surgery.  Please wear clean clothes to the hospital/surgery center.  FAILURE TO FOLLOW THESE INSTRUCTIONS MAY RESULT IN THE CANCELLATION OF YOUR SURGERY PATIENT SIGNATURE_________________________________  NURSE SIGNATURE__________________________________  ________________________________________________________________________

## 2021-09-30 NOTE — Progress Notes (Addendum)
PCP - Dierdre Forth, MD Cardiologist - no  PPM/ICD -  Device Orders -  Rep Notified -   Chest x-ray -  EKG -  Stress Test -  ECHO - 2016 Cardiac Cath -   Sleep Study -  CPAP -   Fasting Blood Sugar -  Checks Blood Sugar _____ times a day  Blood Thinner Instructions: Aspirin Instructions:  ERAS Protcol - PRE-SURGERY Ensure or G2-   COVID TEST- 10-11-21 COVID vaccine -pfizer x3  Activity--Able to walk a flight of stairs without SOB Anesthesia review: HTN  Patient denies shortness of breath, fever, cough and chest pain at PAT appointment   All instructions explained to the patient, with a verbal understanding of the material. Patient agrees to go over the instructions while at home for a better understanding. Patient also instructed to self quarantine after being tested for COVID-19. The opportunity to ask questions was provided.

## 2021-10-01 ENCOUNTER — Encounter (INDEPENDENT_AMBULATORY_CARE_PROVIDER_SITE_OTHER): Payer: Self-pay

## 2021-10-01 ENCOUNTER — Encounter (HOSPITAL_COMMUNITY): Payer: Self-pay

## 2021-10-01 ENCOUNTER — Encounter (HOSPITAL_COMMUNITY)
Admission: RE | Admit: 2021-10-01 | Discharge: 2021-10-01 | Disposition: A | Discharge status: Home / Self Care | Payer: 59 | Source: Ambulatory Visit | Attending: Urology | Admitting: Urology

## 2021-10-01 ENCOUNTER — Other Ambulatory Visit: Payer: Self-pay

## 2021-10-01 VITALS — BP 125/79 | HR 67 | Temp 98.3°F | Resp 17 | Ht 72.0 in | Wt 250.0 lb

## 2021-10-01 DIAGNOSIS — I10 Essential (primary) hypertension: Secondary | ICD-10-CM | POA: Insufficient documentation

## 2021-10-01 DIAGNOSIS — Z01818 Encounter for other preprocedural examination: Secondary | ICD-10-CM | POA: Diagnosis present

## 2021-10-01 HISTORY — DX: Anxiety disorder, unspecified: F41.9

## 2021-10-01 HISTORY — DX: Ventricular premature depolarization: I49.3

## 2021-10-01 HISTORY — DX: Depression, unspecified: F32.A

## 2021-10-01 HISTORY — DX: Personal history of urinary calculi: Z87.442

## 2021-10-01 HISTORY — DX: Headache, unspecified: R51.9

## 2021-10-01 LAB — CBC
HCT: 42.4 % (ref 39.0–52.0)
Hemoglobin: 14.1 g/dL (ref 13.0–17.0)
MCH: 29.1 pg (ref 26.0–34.0)
MCHC: 33.3 g/dL (ref 30.0–36.0)
MCV: 87.6 fL (ref 80.0–100.0)
Platelets: 261 10*3/uL (ref 150–400)
RBC: 4.84 MIL/uL (ref 4.22–5.81)
RDW: 13.5 % (ref 11.5–15.5)
WBC: 6.7 10*3/uL (ref 4.0–10.5)
nRBC: 0 % (ref 0.0–0.2)

## 2021-10-01 LAB — BASIC METABOLIC PANEL
Anion gap: 6 (ref 5–15)
BUN: 12 mg/dL (ref 8–23)
CO2: 26 mmol/L (ref 22–32)
Calcium: 8.7 mg/dL — ABNORMAL LOW (ref 8.9–10.3)
Chloride: 105 mmol/L (ref 98–111)
Creatinine, Ser: 0.99 mg/dL (ref 0.61–1.24)
GFR, Estimated: >60 mL/min (ref >60)
Glucose, Bld: 101 mg/dL — ABNORMAL HIGH (ref 70–99)
Potassium: 3.6 mmol/L (ref 3.5–5.1)
Sodium: 137 mmol/L (ref 135–145)

## 2021-10-11 ENCOUNTER — Other Ambulatory Visit (HOSPITAL_COMMUNITY): Payer: 59

## 2021-10-11 ENCOUNTER — Encounter (HOSPITAL_COMMUNITY)
Admission: RE | Admit: 2021-10-11 | Discharge: 2021-10-11 | Disposition: A | Discharge status: Home / Self Care | Payer: 59 | Source: Ambulatory Visit | Attending: Urology | Admitting: Urology

## 2021-10-11 ENCOUNTER — Other Ambulatory Visit: Payer: Self-pay

## 2021-10-11 DIAGNOSIS — Z01812 Encounter for preprocedural laboratory examination: Secondary | ICD-10-CM | POA: Insufficient documentation

## 2021-10-11 DIAGNOSIS — Z20822 Contact with and (suspected) exposure to covid-19: Secondary | ICD-10-CM | POA: Diagnosis not present

## 2021-10-11 LAB — SARS CORONAVIRUS 2 (TAT 6-24 HRS): SARS Coronavirus 2: NEGATIVE

## 2021-10-12 NOTE — Anesthesia Preprocedure Evaluation (Addendum)
Anesthesia Evaluation  Patient identified by MRN, date of birth, ID band Patient awake    Reviewed: Allergy & Precautions, NPO status , Patient's Chart, lab work & pertinent test results  History of Anesthesia Complications Negative for: history of anesthetic complications  Airway Mallampati: II  TM Distance: >3 FB Neck ROM: Full    Dental no notable dental hx. (+) Dental Advisory Given   Pulmonary neg pulmonary ROS,    Pulmonary exam normal        Cardiovascular hypertension, Pt. on medications and Pt. on home beta blockers Normal cardiovascular exam  2016 Impressions:   - Normal stress echo.  No evidence of ischemia    Neuro/Psych PSYCHIATRIC DISORDERS Anxiety Depression negative neurological ROS     GI/Hepatic Neg liver ROS, GERD  ,  Endo/Other  negative endocrine ROS  Renal/GU negative Renal ROS     Musculoskeletal negative musculoskeletal ROS (+)   Abdominal   Peds  Hematology negative hematology ROS (+)   Anesthesia Other Findings   Reproductive/Obstetrics                            Anesthesia Physical Anesthesia Plan  ASA: 3  Anesthesia Plan: General   Post-op Pain Management: Celebrex PO (pre-op)* and Tylenol PO (pre-op)*   Induction: Intravenous  PONV Risk Score and Plan: 4 or greater and Ondansetron, Dexamethasone, Midazolam and Scopolamine patch - Pre-op  Airway Management Planned: Oral ETT  Additional Equipment:   Intra-op Plan:   Post-operative Plan: Extubation in OR  Informed Consent: I have reviewed the patients History and Physical, chart, labs and discussed the procedure including the risks, benefits and alternatives for the proposed anesthesia with the patient or authorized representative who has indicated his/her understanding and acceptance.     Dental advisory given  Plan Discussed with: Anesthesiologist and CRNA  Anesthesia Plan Comments:         Anesthesia Quick Evaluation

## 2021-10-13 ENCOUNTER — Other Ambulatory Visit: Payer: Self-pay

## 2021-10-13 ENCOUNTER — Observation Stay (HOSPITAL_COMMUNITY)
Admission: RE | Admit: 2021-10-13 | Discharge: 2021-10-14 | Disposition: A | Discharge status: Home / Self Care | Payer: 59 | Attending: Urology | Admitting: Urology

## 2021-10-13 ENCOUNTER — Ambulatory Visit (HOSPITAL_COMMUNITY): Payer: 59 | Admitting: Certified Registered Nurse Anesthetist

## 2021-10-13 ENCOUNTER — Ambulatory Visit (HOSPITAL_BASED_OUTPATIENT_CLINIC_OR_DEPARTMENT_OTHER): Payer: 59 | Admitting: Certified Registered Nurse Anesthetist

## 2021-10-13 ENCOUNTER — Encounter (HOSPITAL_COMMUNITY)
Admission: RE | Disposition: A | Discharge status: Home / Self Care | Payer: Self-pay | Source: Home / Self Care | Attending: Urology

## 2021-10-13 ENCOUNTER — Encounter (HOSPITAL_COMMUNITY): Payer: Self-pay | Admitting: Urology

## 2021-10-13 DIAGNOSIS — N400 Enlarged prostate without lower urinary tract symptoms: Secondary | ICD-10-CM | POA: Diagnosis not present

## 2021-10-13 DIAGNOSIS — K219 Gastro-esophageal reflux disease without esophagitis: Secondary | ICD-10-CM | POA: Diagnosis not present

## 2021-10-13 DIAGNOSIS — I10 Essential (primary) hypertension: Secondary | ICD-10-CM | POA: Insufficient documentation

## 2021-10-13 DIAGNOSIS — F418 Other specified anxiety disorders: Secondary | ICD-10-CM | POA: Diagnosis not present

## 2021-10-13 DIAGNOSIS — N401 Enlarged prostate with lower urinary tract symptoms: Principal | ICD-10-CM | POA: Insufficient documentation

## 2021-10-13 HISTORY — PX: XI ROBOTIC ASSISTED SIMPLE PROSTATECTOMY: SHX6713

## 2021-10-13 HISTORY — DX: Benign prostatic hyperplasia without lower urinary tract symptoms: N40.0

## 2021-10-13 LAB — BASIC METABOLIC PANEL
Anion gap: 7 (ref 5–15)
BUN: 10 mg/dL (ref 8–23)
CO2: 23 mmol/L (ref 22–32)
Calcium: 8.1 mg/dL — ABNORMAL LOW (ref 8.9–10.3)
Chloride: 104 mmol/L (ref 98–111)
Creatinine, Ser: 1.06 mg/dL (ref 0.61–1.24)
GFR, Estimated: >60 mL/min (ref >60)
Glucose, Bld: 124 mg/dL — ABNORMAL HIGH (ref 70–99)
Potassium: 4.3 mmol/L (ref 3.5–5.1)
Sodium: 134 mmol/L — ABNORMAL LOW (ref 135–145)

## 2021-10-13 LAB — TYPE AND SCREEN
ABO/RH(D): A POS
Antibody Screen: NEGATIVE

## 2021-10-13 LAB — HEMOGLOBIN AND HEMATOCRIT, BLOOD
HCT: 40.5 % (ref 39.0–52.0)
Hemoglobin: 13.4 g/dL (ref 13.0–17.0)

## 2021-10-13 LAB — ABO/RH: ABO/RH(D): A POS

## 2021-10-13 SURGERY — PROSTATECTOMY, SIMPLE, ROBOT-ASSISTED
Anesthesia: General

## 2021-10-13 MED ORDER — ORAL CARE MOUTH RINSE: 15.0000 mL | Freq: Once | OROMUCOSAL | Status: AC

## 2021-10-13 MED ORDER — ACETAMINOPHEN 500 MG PO TABS
1000.0000 mg | ORAL_TABLET | Freq: Once | ORAL | Status: AC
  Administered 2021-10-13: 1000 mg via ORAL
  Filled 2021-10-13: qty 2

## 2021-10-13 MED ORDER — ACETAMINOPHEN 500 MG PO TABS: 1000.0000 mg | ORAL_TABLET | Freq: Four times a day (QID) | ORAL | Status: DC

## 2021-10-13 MED ORDER — CEFAZOLIN SODIUM-DEXTROSE 2-4 GM/100ML-% IV SOLN
2.0000 g | INTRAVENOUS | Status: AC
  Administered 2021-10-13: 2 g via INTRAVENOUS
  Filled 2021-10-13: qty 100

## 2021-10-13 MED ORDER — SODIUM CHLORIDE 0.9 % IV BOLUS
1000.0000 mL | Freq: Once | INTRAVENOUS | Status: AC
  Administered 2021-10-13: 1000 mL via INTRAVENOUS

## 2021-10-13 MED ORDER — SCOPOLAMINE 1 MG/3DAYS TD PT72
1.0000 | MEDICATED_PATCH | TRANSDERMAL | Status: DC
  Administered 2021-10-13: 1.5 mg via TRANSDERMAL
  Filled 2021-10-13: qty 1

## 2021-10-13 MED ORDER — HYDROCODONE-ACETAMINOPHEN 5-325 MG PO TABS: 1.0000 | ORAL_TABLET | Freq: Four times a day (QID) | ORAL | 0 refills | Status: DC | PRN

## 2021-10-13 MED ORDER — EPHEDRINE SULFATE-NACL 50-0.9 MG/10ML-% IV SOSY
PREFILLED_SYRINGE | INTRAVENOUS | Status: DC | PRN
Start: 2021-10-13 — End: 2021-10-13
  Administered 2021-10-13: 5 mg via INTRAVENOUS

## 2021-10-13 MED ORDER — PHENYLEPHRINE 40 MCG/ML (10ML) SYRINGE FOR IV PUSH (FOR BLOOD PRESSURE SUPPORT)
PREFILLED_SYRINGE | INTRAVENOUS | Status: DC | PRN
  Administered 2021-10-13: 80 ug via INTRAVENOUS
  Administered 2021-10-13 (×2): 120 ug via INTRAVENOUS

## 2021-10-13 MED ORDER — SUGAMMADEX SODIUM 200 MG/2ML IV SOLN
INTRAVENOUS | Status: DC | PRN
  Administered 2021-10-13: 200 mg via INTRAVENOUS

## 2021-10-13 MED ORDER — CIPROFLOXACIN HCL 500 MG PO TABS: 500.0000 mg | ORAL_TABLET | Freq: Two times a day (BID) | ORAL | 0 refills | Status: DC

## 2021-10-13 MED ORDER — DULOXETINE HCL 60 MG PO CPEP
60.0000 mg | ORAL_CAPSULE | Freq: Every day | ORAL | Status: DC
  Administered 2021-10-14: 60 mg via ORAL
  Filled 2021-10-13: qty 1

## 2021-10-13 MED ORDER — ONDANSETRON HCL 4 MG/2ML IJ SOLN: 4.0000 mg | INTRAMUSCULAR | Status: DC | PRN

## 2021-10-13 MED ORDER — ALBUMIN HUMAN 5 % IV SOLN: INTRAVENOUS | Status: DC | PRN

## 2021-10-13 MED ORDER — SODIUM CHLORIDE 0.9% FLUSH: 3.0000 mL | INTRAVENOUS | Status: DC | PRN

## 2021-10-13 MED ORDER — LACTATED RINGERS IV SOLN: INTRAVENOUS | Status: DC

## 2021-10-13 MED ORDER — MIDAZOLAM HCL 2 MG/2ML IJ SOLN
INTRAMUSCULAR | Status: AC
  Filled 2021-10-13: qty 2

## 2021-10-13 MED ORDER — SODIUM CHLORIDE 0.9 % IV SOLN: INTRAVENOUS | Status: DC

## 2021-10-13 MED ORDER — SODIUM CHLORIDE (PF) 0.9 % IJ SOLN
INTRAMUSCULAR | Status: DC | PRN
  Administered 2021-10-13: 20 mL

## 2021-10-13 MED ORDER — LIDOCAINE HCL (PF) 2 % IJ SOLN
INTRAMUSCULAR | Status: AC
  Filled 2021-10-13: qty 5

## 2021-10-13 MED ORDER — BUPIVACAINE LIPOSOME 1.3 % IJ SUSP
INTRAMUSCULAR | Status: DC | PRN
  Administered 2021-10-13: 20 mL

## 2021-10-13 MED ORDER — ROCURONIUM BROMIDE 10 MG/ML (PF) SYRINGE
PREFILLED_SYRINGE | INTRAVENOUS | Status: AC
  Filled 2021-10-13: qty 10

## 2021-10-13 MED ORDER — AMISULPRIDE (ANTIEMETIC) 5 MG/2ML IV SOLN: 10.0000 mg | Freq: Once | INTRAVENOUS | Status: DC | PRN

## 2021-10-13 MED ORDER — TRAMADOL HCL 50 MG PO TABS
50.0000 mg | ORAL_TABLET | Freq: Four times a day (QID) | ORAL | Status: DC | PRN
  Administered 2021-10-13 – 2021-10-14 (×2): 50 mg via ORAL
  Filled 2021-10-13 (×2): qty 1

## 2021-10-13 MED ORDER — BACITRACIN-NEOMYCIN-POLYMYXIN 400-5-5000 EX OINT: 1.0000 "application " | TOPICAL_OINTMENT | Freq: Three times a day (TID) | CUTANEOUS | Status: DC | PRN

## 2021-10-13 MED ORDER — ONDANSETRON HCL 4 MG/2ML IJ SOLN
INTRAMUSCULAR | Status: AC
  Filled 2021-10-13: qty 2

## 2021-10-13 MED ORDER — ACETAMINOPHEN 10 MG/ML IV SOLN
1000.0000 mg | Freq: Four times a day (QID) | INTRAVENOUS | Status: DC
Start: 2021-10-13 — End: 2021-10-14
  Administered 2021-10-13 – 2021-10-14 (×3): 1000 mg via INTRAVENOUS
  Filled 2021-10-13 (×3): qty 100

## 2021-10-13 MED ORDER — MIDAZOLAM HCL 5 MG/5ML IJ SOLN
INTRAMUSCULAR | Status: DC | PRN
  Administered 2021-10-13: 2 mg via INTRAVENOUS

## 2021-10-13 MED ORDER — OXYBUTYNIN CHLORIDE 5 MG PO TABS: 5.0000 mg | ORAL_TABLET | Freq: Three times a day (TID) | ORAL | Status: DC | PRN

## 2021-10-13 MED ORDER — RISPERIDONE 1 MG PO TABS
1.0000 mg | ORAL_TABLET | Freq: Every day | ORAL | Status: DC
  Administered 2021-10-13: 1 mg via ORAL
  Filled 2021-10-13: qty 1

## 2021-10-13 MED ORDER — ONDANSETRON HCL 4 MG/2ML IJ SOLN
INTRAMUSCULAR | Status: DC | PRN
  Administered 2021-10-13: 4 mg via INTRAVENOUS

## 2021-10-13 MED ORDER — FENTANYL CITRATE PF 50 MCG/ML IJ SOSY: 25.0000 ug | PREFILLED_SYRINGE | INTRAMUSCULAR | Status: DC | PRN

## 2021-10-13 MED ORDER — PROPOFOL 10 MG/ML IV BOLUS
INTRAVENOUS | Status: AC
  Filled 2021-10-13: qty 20

## 2021-10-13 MED ORDER — DEXAMETHASONE SODIUM PHOSPHATE 10 MG/ML IJ SOLN
INTRAMUSCULAR | Status: AC
  Filled 2021-10-13: qty 1

## 2021-10-13 MED ORDER — ALFUZOSIN HCL ER 10 MG PO TB24
10.0000 mg | ORAL_TABLET | Freq: Every day | ORAL | Status: DC
  Administered 2021-10-14: 10 mg via ORAL
  Filled 2021-10-13: qty 1

## 2021-10-13 MED ORDER — SODIUM CHLORIDE 0.9 % IV SOLN
250.0000 mL | INTRAVENOUS | Status: DC | PRN
Start: 2021-10-14 — End: 2021-10-14

## 2021-10-13 MED ORDER — SODIUM CHLORIDE 0.9% FLUSH
3.0000 mL | Freq: Two times a day (BID) | INTRAVENOUS | Status: DC
  Administered 2021-10-13: 3 mL via INTRAVENOUS

## 2021-10-13 MED ORDER — TRAMADOL HCL 50 MG PO TABS: 100.0000 mg | ORAL_TABLET | Freq: Four times a day (QID) | ORAL | Status: DC | PRN

## 2021-10-13 MED ORDER — DOCUSATE SODIUM 100 MG PO CAPS
100.0000 mg | ORAL_CAPSULE | Freq: Two times a day (BID) | ORAL | Status: DC
  Administered 2021-10-13 – 2021-10-14 (×2): 100 mg via ORAL
  Filled 2021-10-13 (×2): qty 1

## 2021-10-13 MED ORDER — PHENYLEPHRINE 40 MCG/ML (10ML) SYRINGE FOR IV PUSH (FOR BLOOD PRESSURE SUPPORT)
PREFILLED_SYRINGE | INTRAVENOUS | Status: AC
  Filled 2021-10-13: qty 10

## 2021-10-13 MED ORDER — BUPIVACAINE LIPOSOME 1.3 % IJ SUSP
INTRAMUSCULAR | Status: AC
  Filled 2021-10-13: qty 20

## 2021-10-13 MED ORDER — LACTATED RINGERS IR SOLN
Status: DC | PRN
  Administered 2021-10-13: 1000 mL

## 2021-10-13 MED ORDER — PROMETHAZINE HCL 25 MG/ML IJ SOLN: 6.2500 mg | INTRAMUSCULAR | Status: DC | PRN

## 2021-10-13 MED ORDER — AMLODIPINE BESYLATE 5 MG PO TABS
5.0000 mg | ORAL_TABLET | Freq: Every day | ORAL | Status: DC
  Administered 2021-10-14: 5 mg via ORAL
  Filled 2021-10-13: qty 1

## 2021-10-13 MED ORDER — FENTANYL CITRATE (PF) 100 MCG/2ML IJ SOLN
INTRAMUSCULAR | Status: DC | PRN
  Administered 2021-10-13 (×3): 50 ug via INTRAVENOUS
  Administered 2021-10-13: 100 ug via INTRAVENOUS
  Administered 2021-10-13 (×2): 50 ug via INTRAVENOUS

## 2021-10-13 MED ORDER — METOPROLOL TARTRATE 50 MG PO TABS
50.0000 mg | ORAL_TABLET | Freq: Two times a day (BID) | ORAL | Status: DC
  Administered 2021-10-13 – 2021-10-14 (×2): 50 mg via ORAL
  Filled 2021-10-13 (×2): qty 1

## 2021-10-13 MED ORDER — LIDOCAINE 2% (20 MG/ML) 5 ML SYRINGE
INTRAMUSCULAR | Status: DC | PRN
  Administered 2021-10-13: 100 mg via INTRAVENOUS

## 2021-10-13 MED ORDER — FENTANYL CITRATE (PF) 250 MCG/5ML IJ SOLN
INTRAMUSCULAR | Status: AC
  Filled 2021-10-13: qty 5

## 2021-10-13 MED ORDER — CEFAZOLIN SODIUM-DEXTROSE 1-4 GM/50ML-% IV SOLN
1.0000 g | Freq: Three times a day (TID) | INTRAVENOUS | Status: AC
  Administered 2021-10-13 – 2021-10-14 (×2): 1 g via INTRAVENOUS
  Filled 2021-10-13 (×2): qty 50

## 2021-10-13 MED ORDER — DEXAMETHASONE SODIUM PHOSPHATE 4 MG/ML IJ SOLN
INTRAMUSCULAR | Status: DC | PRN
  Administered 2021-10-13: 10 mg via INTRAVENOUS

## 2021-10-13 MED ORDER — ROCURONIUM BROMIDE 10 MG/ML (PF) SYRINGE
PREFILLED_SYRINGE | INTRAVENOUS | Status: DC | PRN
  Administered 2021-10-13: 100 mg via INTRAVENOUS

## 2021-10-13 MED ORDER — POLYETHYLENE GLYCOL 3350 17 GM/SCOOP PO POWD
1.0000 | Freq: Once | ORAL | Status: DC
  Filled 2021-10-13: qty 255

## 2021-10-13 MED ORDER — SODIUM CHLORIDE (PF) 0.9 % IJ SOLN
INTRAMUSCULAR | Status: AC
  Filled 2021-10-13: qty 20

## 2021-10-13 MED ORDER — DOCUSATE SODIUM 100 MG PO CAPS: 100.0000 mg | ORAL_CAPSULE | Freq: Two times a day (BID) | ORAL | Status: DC

## 2021-10-13 MED ORDER — EPHEDRINE 5 MG/ML INJ
INTRAVENOUS | Status: AC
  Filled 2021-10-13: qty 5

## 2021-10-13 MED ORDER — STERILE WATER FOR IRRIGATION IR SOLN
Status: DC | PRN
Start: 2021-10-13 — End: 2021-10-13
  Administered 2021-10-13: 1000 mL

## 2021-10-13 MED ORDER — HYDROMORPHONE HCL 1 MG/ML IJ SOLN: 0.5000 mg | INTRAMUSCULAR | Status: DC | PRN

## 2021-10-13 MED ORDER — CHLORHEXIDINE GLUCONATE 0.12 % MT SOLN
15.0000 mL | Freq: Once | OROMUCOSAL | Status: AC
  Administered 2021-10-13: 15 mL via OROMUCOSAL

## 2021-10-13 MED ORDER — ALBUMIN HUMAN 5 % IV SOLN
INTRAVENOUS | Status: AC
  Filled 2021-10-13: qty 250

## 2021-10-13 MED ORDER — ALPRAZOLAM 0.25 MG PO TABS: 0.2500 mg | ORAL_TABLET | Freq: Three times a day (TID) | ORAL | Status: DC | PRN

## 2021-10-13 MED ORDER — FENTANYL CITRATE (PF) 100 MCG/2ML IJ SOLN
INTRAMUSCULAR | Status: AC
  Filled 2021-10-13: qty 2

## 2021-10-13 MED ORDER — PROPOFOL 10 MG/ML IV BOLUS
INTRAVENOUS | Status: DC | PRN
  Administered 2021-10-13: 150 mg via INTRAVENOUS

## 2021-10-13 SURGICAL SUPPLY — 68 items
APPLICATOR COTTON TIP 6 STRL (MISCELLANEOUS) ×1 IMPLANT
APPLICATOR COTTON TIP 6IN STRL (MISCELLANEOUS) ×2
BAG COUNTER SPONGE SURGICOUNT (BAG) IMPLANT
CATH FOLEY 2WAY SLVR 18FR 30CC (CATHETERS) ×2 IMPLANT
CATH FOLEY 3WAY 30CC 24FR (CATHETERS) ×2
CATH TIEMANN FOLEY 18FR 5CC (CATHETERS) IMPLANT
CATH URTH STD 24FR FL 3W 2 (CATHETERS) ×1 IMPLANT
CHLORAPREP W/TINT 26 (MISCELLANEOUS) ×2 IMPLANT
CLIP LIGATING HEM O LOK PURPLE (MISCELLANEOUS) IMPLANT
CLOTH BEACON ORANGE TIMEOUT ST (SAFETY) ×2 IMPLANT
COVER SURGICAL LIGHT HANDLE (MISCELLANEOUS) ×2 IMPLANT
COVER TIP SHEARS 8 DVNC (MISCELLANEOUS) ×1 IMPLANT
COVER TIP SHEARS 8MM DA VINCI (MISCELLANEOUS) ×2
CUTTER ECHEON FLEX ENDO 45 340 (ENDOMECHANICALS) ×1 IMPLANT
DERMABOND ADVANCED (GAUZE/BANDAGES/DRESSINGS) ×1
DERMABOND ADVANCED .7 DNX12 (GAUZE/BANDAGES/DRESSINGS) ×1 IMPLANT
DRAIN CHANNEL RND F F (WOUND CARE) IMPLANT
DRAPE ARM DVNC X/XI (DISPOSABLE) ×4 IMPLANT
DRAPE COLUMN DVNC XI (DISPOSABLE) ×1 IMPLANT
DRAPE DA VINCI XI ARM (DISPOSABLE) ×8
DRAPE DA VINCI XI COLUMN (DISPOSABLE) ×2
DRAPE SURG IRRIG POUCH 19X23 (DRAPES) ×2 IMPLANT
DRSG TEGADERM 4X4.75 (GAUZE/BANDAGES/DRESSINGS) ×4 IMPLANT
ELECT PENCIL ROCKER SW 15FT (MISCELLANEOUS) ×2 IMPLANT
ELECT REM PT RETURN 15FT ADLT (MISCELLANEOUS) ×2 IMPLANT
GLOVE SURG ENC MOIS LTX SZ6.5 (GLOVE) ×2 IMPLANT
GLOVE SURG ENC TEXT LTX SZ7.5 (GLOVE) ×4 IMPLANT
GLOVE SURG UNDER POLY LF SZ7.5 (GLOVE) ×2 IMPLANT
GOWN STRL REUS W/TWL LRG LVL3 (GOWN DISPOSABLE) ×6 IMPLANT
HOLDER FOLEY CATH W/STRAP (MISCELLANEOUS) ×2 IMPLANT
IRRIG SUCT STRYKERFLOW 2 WTIP (MISCELLANEOUS) ×2
IRRIGATION SUCT STRKRFLW 2 WTP (MISCELLANEOUS) ×1 IMPLANT
IV LACTATED RINGERS 1000ML (IV SOLUTION) ×2 IMPLANT
KIT TURNOVER KIT A (KITS) IMPLANT
NDL INSUFFLATION 14GA 120MM (NEEDLE) ×1 IMPLANT
NEEDLE INSUFFLATION 14GA 120MM (NEEDLE) ×2 IMPLANT
PACK ROBOT UROLOGY CUSTOM (CUSTOM PROCEDURE TRAY) ×2 IMPLANT
PAD POSITIONING PINK XL (MISCELLANEOUS) ×2 IMPLANT
PENCIL SMOKE EVACUATOR (MISCELLANEOUS) IMPLANT
PORT ACCESS TROCAR AIRSEAL 12 (TROCAR) ×1 IMPLANT
PORT ACCESS TROCAR AIRSEAL 5M (TROCAR) ×1
RELOAD STAPLE 45 4.1 GRN THCK (STAPLE) IMPLANT
SEAL CANN UNIV 5-8 DVNC XI (MISCELLANEOUS) ×4 IMPLANT
SEAL XI 5MM-8MM UNIVERSAL (MISCELLANEOUS) ×8
SET TRI-LUMEN FLTR TB AIRSEAL (TUBING) ×2 IMPLANT
SOLUTION ELECTROLUBE (MISCELLANEOUS) ×2 IMPLANT
SPIKE FLUID TRANSFER (MISCELLANEOUS) ×2 IMPLANT
SPONGE T-LAP 4X18 ~~LOC~~+RFID (SPONGE) ×1 IMPLANT
STAPLE RELOAD 45 GRN (STAPLE) ×1 IMPLANT
STAPLE RELOAD 45MM GREEN (STAPLE) ×2
SUT ETHILON 3 0 PS 1 (SUTURE) ×2 IMPLANT
SUT MNCRL AB 4-0 PS2 18 (SUTURE) ×4 IMPLANT
SUT PDS AB 1 CT1 27 (SUTURE) ×4 IMPLANT
SUT V-LOC BARB 180 2/0GR6 GS22 (SUTURE) ×4
SUT VIC AB 0 CT1 27 (SUTURE) ×10
SUT VIC AB 0 CT1 27XBRD ANTBC (SUTURE) ×3 IMPLANT
SUT VIC AB 2-0 SH 27 (SUTURE) ×2
SUT VIC AB 2-0 SH 27X BRD (SUTURE) ×1 IMPLANT
SUT VICRYL 0 UR6 27IN ABS (SUTURE) ×2 IMPLANT
SUT VLOC 180 2-0 9IN GS21 (SUTURE) ×2 IMPLANT
SUT VLOC 180 3-0 9IN GS21 (SUTURE) ×2 IMPLANT
SUT VLOC BARB 180 ABS3/0GR12 (SUTURE)
SUTURE V-LC BRB 180 2/0GR6GS22 (SUTURE) ×2 IMPLANT
SUTURE VLOC BRB 180 ABS3/0GR12 (SUTURE) ×2 IMPLANT
TOWEL OR NON WOVEN STRL DISP B (DISPOSABLE) ×2 IMPLANT
TROCAR BLADELESS OPT 5 100 (ENDOMECHANICALS) IMPLANT
TROCAR XCEL NON-BLD 5MMX100MML (ENDOMECHANICALS) IMPLANT
WATER STERILE IRR 1000ML POUR (IV SOLUTION) ×2 IMPLANT

## 2021-10-13 NOTE — Brief Op Note (Signed)
10/13/2021  10:48 AM  PATIENT:  Bryan Sosa  66 y.o. male  PRE-OPERATIVE DIAGNOSIS:  LARGE PROSTATE  POST-OPERATIVE DIAGNOSIS:  LARGE PROSTATE  PROCEDURE:  Procedure(s): XI ROBOTIC ASSISTED SIMPLE PROSTATECTOMY (N/A)  SURGEON:  Surgeon(s) and Role:    Alexis Frock, MD - Primary  PHYSICIAN ASSISTANT:   ASSISTANTS: Debbrah Alar PA   ANESTHESIA:   local and general  EBL:  50 mL   BLOOD ADMINISTERED:none  DRAINS:  1 - JP to bulb; 2 - Foley to gravity    LOCAL MEDICATIONS USED:  MARCAINE     SPECIMEN:  Source of Specimen:  prostate adenoma  DISPOSITION OF SPECIMEN:  PATHOLOGY  COUNTS:  YES  TOURNIQUET:  * No tourniquets in log *  DICTATION: .Other Dictation: Dictation Number [id number]  PLAN OF CARE: Admit for overnight observation  PATIENT DISPOSITION:  PACU - hemodynamically stable.   Delay start of Pharmacological VTE agent (>24hrs) due to surgical blood loss or risk of bleeding: yes

## 2021-10-13 NOTE — Transfer of Care (Signed)
Immediate Anesthesia Transfer of Care Note  Patient: Bryan Sosa  Procedure(s) Performed: XI ROBOTIC ASSISTED SIMPLE PROSTATECTOMY  Patient Location: PACU  Anesthesia Type:General  Level of Consciousness: awake and patient cooperative  Airway & Oxygen Therapy: Patient Spontanous Breathing and Patient connected to face mask  Post-op Assessment: Report given to RN and Post -op Vital signs reviewed and stable  Post vital signs: Reviewed and stable  Last Vitals:  Vitals Value Taken Time  BP 172/105 10/13/21 1102  Temp    Pulse 94 10/13/21 1103  Resp 22 10/13/21 1103  SpO2 96 % 10/13/21 1103  Vitals shown include unvalidated device data.  Last Pain:  Vitals:   10/13/21 0703  PainSc: 0-No pain         Complications: No notable events documented.

## 2021-10-13 NOTE — Discharge Instructions (Signed)

## 2021-10-13 NOTE — Anesthesia Postprocedure Evaluation (Signed)
Anesthesia Post Note  Patient: Bryan Sosa  Procedure(s) Performed: XI ROBOTIC ASSISTED SIMPLE PROSTATECTOMY     Patient location during evaluation: PACU Anesthesia Type: General Level of consciousness: sedated Pain management: pain level controlled Vital Signs Assessment: post-procedure vital signs reviewed and stable Respiratory status: spontaneous breathing and respiratory function stable Cardiovascular status: stable Postop Assessment: no apparent nausea or vomiting Anesthetic complications: no   No notable events documented.  Last Vitals:  Vitals:   10/13/21 1200 10/13/21 1215  BP: 125/78 122/75  Pulse: 90 89  Resp: 11 (!) 9  Temp:    SpO2: 95% 93%    Last Pain:  Vitals:   10/13/21 1215  PainSc: Asleep                 Tekla Malachowski DANIEL

## 2021-10-13 NOTE — Anesthesia Procedure Notes (Signed)
Procedure Name: Intubation Date/Time: 10/13/2021 8:24 AM Performed by: Claudia Desanctis, CRNA Pre-anesthesia Checklist: Patient identified, Emergency Drugs available, Suction available and Patient being monitored Patient Re-evaluated:Patient Re-evaluated prior to induction Oxygen Delivery Method: Circle system utilized Preoxygenation: Pre-oxygenation with 100% oxygen Induction Type: IV induction Ventilation: Mask ventilation without difficulty Laryngoscope Size: 2 and Miller Grade View: Grade I Tube type: Oral Tube size: 7.5 mm Number of attempts: 1 Airway Equipment and Method: Stylet Placement Confirmation: ETT inserted through vocal cords under direct vision, positive ETCO2 and breath sounds checked- equal and bilateral Secured at: 23 cm Tube secured with: Tape Dental Injury: Teeth and Oropharynx as per pre-operative assessment

## 2021-10-13 NOTE — H&P (Signed)
Bryan Sosa is an 66 y.o. male.    Chief Complaint: Pre-OP Simple Prostatectomy  HPI:   1. Very Large Prostate With Refractory Lower Urinary Tract Symptoms - s/p TUIP with Dr. Risa Grill in 2007. Alpha blockers now and persistent bother. TRUS 2022 133gm, no median. Cysto 2021 no strictures. PVR <197mL. Has on 5ARI years ago.   2. Hematuria / Prostate Bleeding- CT from 05/19/20 negative for an overt source of his hematuria. Cystoscopy with Dr. Matilde Sprang revealed friable prostatic urethral mucosa and an enlarged prostate. He was also noted to have a 5 mm right renal stone on CT.   3.  Elevated PSA - negative BX 2022 on eval PSA 4.   PMH sig for obesity, anxiety (well controlled on meds). 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 His PCP is Bryan Dus MD.   Today " Bryan Sosa " is seen to proceed with simple prostatectomy for med-refractory lower urinary tract sympotms. No interval fevers. C19 screen negative. Most recetn UCX negative.   Past Medical History:  Diagnosis Date   Anxiety    Depression    ED (erectile dysfunction)    GERD (gastroesophageal reflux disease)    Headache    migraines   History of kidney stones    Hypertension    Insomnia    PVC (premature ventricular contraction)     Past Surgical History:  Procedure Laterality Date   bladder neck obstruction     2008    Family History  Problem Relation Age of Onset   Ulcers Mother    Heart attack Father    Social History:  reports that he has never smoked. He has never used smokeless tobacco. He reports that he does not drink alcohol and does not use drugs.  Allergies:  Allergies  Allergen Reactions   Sulfa Antibiotics Hives    Medications Prior to Admission  Medication Sig Dispense Refill   alfuzosin (UROXATRAL) 10 MG 24 hr tablet Take 10 mg by mouth daily with breakfast.     ALPRAZolam (XANAX) 0.5 MG tablet TAKE 1/2 TABLET BY MOUTH THREE TIMES DAILY AND 1 AT BEDTIME (Patient taking differently: Take 0.25-0.5  mg by mouth See admin instructions. 0.25 mg daily (may take an additional second dose if needed), and 0.5 mg at bedtime) 75 tablet 5   amLODipine (NORVASC) 5 MG tablet Take 5 mg by mouth daily.     aspirin-acetaminophen-caffeine (EXCEDRIN MIGRAINE) 250-250-65 MG tablet Take 1 tablet by mouth every 6 (six) hours as needed for headache.     DULoxetine (CYMBALTA) 60 MG capsule Take 1 capsule (60 mg total) by mouth daily. 90 capsule 2   irbesartan (AVAPRO) 300 MG tablet Take 300 mg by mouth daily.     metoprolol tartrate (LOPRESSOR) 50 MG tablet Take 50 mg by mouth in the morning, at noon, and at bedtime. Morning evening   7 pm and 8 pm  and bedtime  11pm     risperiDONE (RISPERDAL) 1 MG tablet Take 1 tablet (1 mg total) by mouth at bedtime. 90 tablet 1   tadalafil (CIALIS) 5 MG tablet Take 5 mg by mouth daily as needed for erectile dysfunction.      Results for orders placed or performed during the hospital encounter of 10/11/21 (from the past 48 hour(s))  SARS CORONAVIRUS 2 (TAT 6-24 HRS) Nasopharyngeal Nasopharyngeal Swab     Status: None   Collection Time: 10/11/21  7:09 AM   Specimen: Nasopharyngeal Swab  Result Value Ref Range  SARS Coronavirus 2 NEGATIVE NEGATIVE    Comment: (NOTE) SARS-CoV-2 target nucleic acids are NOT DETECTED.  The SARS-CoV-2 RNA is generally detectable in upper and lower respiratory specimens during the acute phase of infection. Negative results do not preclude SARS-CoV-2 infection, do not rule out co-infections with other pathogens, and should not be used as the sole basis for treatment or other patient management decisions. Negative results must be combined with clinical observations, patient history, and epidemiological information. The expected result is Negative.  Fact Sheet for Patients: SugarRoll.be  Fact Sheet for Healthcare Providers: https://www.woods-mathews.com/  This test is not yet approved or cleared by  the [place] FDA and  has been authorized for detection and/or diagnosis of SARS-CoV-2 by FDA under an Emergency Use Authorization (EUA). This EUA will remain  in effect (meaning this test can be used) for the duration of the COVID-19 declaration under Se ction 564(b)(1) of the Act, 21 U.S.C. section 360bbb-3(b)(1), unless the authorization is terminated or revoked sooner.  Performed at [facility], [place] [address] [place], [place] [address]    No results found.  Review of Systems  Constitutional:  Negative for chills and fever.  All other systems reviewed and are negative.  There were no vitals taken for this visit. Physical Exam Vitals reviewed.  HENT:     Head: Normocephalic.  Eyes:     Pupils: Pupils are equal, round, and reactive to light.  Cardiovascular:     Rate and Rhythm: Normal rate.  Pulmonary:     Effort: Pulmonary effort is normal.  Abdominal:     Comments: Stable truncal obesity  Genitourinary:    Comments: No CVAT Musculoskeletal:        General: Normal range of motion.     Cervical back: Normal range of motion.  Neurological:     General: No focal deficit present.     Mental Status: He is alert.  Psychiatric:        Mood and Affect: Mood normal.     Assessment/Plan  Proceed as planned with robotic simple prostattecotmy. Risks, benefits, alternatives, expected peri-op course discussed previously and reiterated today.  Alexis Frock, MD 10/13/2021, 6:52 AM

## 2021-10-14 ENCOUNTER — Encounter (HOSPITAL_COMMUNITY): Payer: Self-pay | Admitting: Urology

## 2021-10-14 DIAGNOSIS — N401 Enlarged prostate with lower urinary tract symptoms: Secondary | ICD-10-CM | POA: Diagnosis not present

## 2021-10-14 LAB — BASIC METABOLIC PANEL
Anion gap: 5 (ref 5–15)
BUN: 11 mg/dL (ref 8–23)
CO2: 25 mmol/L (ref 22–32)
Calcium: 8 mg/dL — ABNORMAL LOW (ref 8.9–10.3)
Chloride: 109 mmol/L (ref 98–111)
Creatinine, Ser: 0.81 mg/dL (ref 0.61–1.24)
GFR, Estimated: >60 mL/min (ref >60)
Glucose, Bld: 121 mg/dL — ABNORMAL HIGH (ref 70–99)
Potassium: 3.7 mmol/L (ref 3.5–5.1)
Sodium: 139 mmol/L (ref 135–145)

## 2021-10-14 LAB — HEMOGLOBIN AND HEMATOCRIT, BLOOD
HCT: 34.4 % — ABNORMAL LOW (ref 39.0–52.0)
Hemoglobin: 11.4 g/dL — ABNORMAL LOW (ref 13.0–17.0)

## 2021-10-14 MED ORDER — CHLORHEXIDINE GLUCONATE CLOTH 2 % EX PADS
6.0000 | MEDICATED_PAD | Freq: Every day | CUTANEOUS | Status: DC
  Administered 2021-10-14: 6 via TOPICAL

## 2021-10-14 NOTE — Op Note (Signed)
NAME: Bryan Sosa, Bryan Sosa MEDICAL RECORD NO: [id number] ACCOUNT NO: [id number] DATE OF BIRTH: [birth year] FACILITY: [facility] LOCATION: [facility] PHYSICIAN: Alexis Frock, MD  Operative Report   DATE OF PROCEDURE: 10/13/2021  PROCEDURE:  Robotic-assisted laparoscopic simple prostatectomy.  ESTIMATED BLOOD LOSS:  50 mL  COMPLICATIONS:  None.  SPECIMENS:  Prostate adenoma for permanent pathology.  FINDINGS:  Prostate bilobar hypertrophy.  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  He does have a very large prostate.  He has been on medical therapy for a number of years.  His symptoms have  become refractory.  He did have a negative prostate biopsy, was confirmed no cancerous involvement.  Prostate volume calculated to be 130 grams.  Options were discussed including continued care versus outlying procedure for simple prostatectomy being  referred to his gland size.  He wished to proceed.  Informed consent was obtained and placed in medical record.  PROCEDURE DETAILS:  The patient was identified and procedure being robotic simple prostatectomy was confirmed.  Procedure timeout was performed.  Intravenous antibiotics were administered.  General endotracheal anesthesia induced.  The patient was placed  into a low lithotomy position.  Sterile field was created by prepping and draping the patient's penis, perineum, and proximal thigh using iodine and his infra-xiphoid abdomen using chlorhexidine gluconate after clipper shaving and further fashioned to  the operating table using 3-inch tape over foam padding across the supraxiphoid chest.  His arms were tucked to the side and padded with gel rolls.  A Foley catheter was placed for straight drain.  A high flow, low-pressure  pneumoperitoneum was obtained  using Veress technique in the supraumbilical midline, having passed the aspiration and drop test.  An 8 mm  camera port was then placed in same location.  Laparoscopic examination of peritoneal cavity revealed no significant adhesions and no visceral  injury.  Additional ports were placed as follows:  Right paramedian 8 mm robotic port, right far lateral 12 mm AirSeal assist port, right paramedian 5 mm suction port, left paramedian 8 mm robotic port, left far lateral 8 mm robotic port.  Robot was  docked and passed the electronic checks.  Initial attention was directed at development of the space of Retzius.  Incision was made lateral to the right median umbilical ligament towards the midline towards the internal ring coursing along the iliac  vessels towards the area of the right ureter and the right bladder wall swept away from the left sidewall towards the area of the endopelvic fascia on the right side.  A mirror image dissection was performed on the left side.  Anterior attachments were  taken down with cautery scissors.  The anterior base area was defatted to better denote the bladder and prostate junction and the endopelvic fascia was carefully swept away from the apical aspect of the prostate just enough to visualize the dorsal venous  complex, which was then controlled using green load stapler, taking exquisite care to avoid membranous urethral injury.  The true bladder neck was then identified and then an inverted U cystotomy was made approximately 1 cm proximal to this, dropping  the bladder away from the prostate.  This exposed the large bilobar prostatic hypertrophy.  There was no significant median lobe.  Ureteral orifices were easily visualized. Vicryl stay sutures were applied in figure-of-eight fashion into each lobe of the  prostate for retraction.  The abdomen was placed on anterior superior retraction.  A  posterior mucosal incision was made approximately 8 mm distal to the ureteral orifices.  The adenoma plane was entered posteriorly.  This was carried down to the area  of the apex in the  midline and carefully swept laterally and carried on the right lobe of the prostate all the way to the 12 o'clock position, then the left lobe to the 12 o'clock position, taking exquisite care to avoid prostate capsule perforation.   Final apical dissection was performed by incising the adenoma in the midline in a butterfly type fashion following the catheter.  This exposed the apical area very well and true apex was released from the area of the membranous urethra.  This freed up the large  adenoma specimen was placed in an EndoCatch bag for later retrieval.  Additional hemostasis was achieved in the prostate fossa using point coagulation current.  Next, digital rectal exam was performed using indicator glove and laparoscopic vision.  No  evidence of rectal violation was noted.  Attention was directed at posterior mucosal advancement.  A 3-0 double-armed V-Loc suture was used to bring the posterior bladder neck mucosa and the posterior membranous urethra into tension-free apposition.   This was performed for approximately 50% of its circumference taking exquisite care to avoid any injury to ureteral orifices and these remained visually patent.  Next, the prior cystotomy was then closed using 2 separate running suture lines of 2-0 V-Loc  meeting in the midline, tied to each other.  A new 3-way Foley catheter was placed per urethra, which irrigated quantitatively.  Procedure was terminated.  The patient tolerated procedure well.  No immediate perioperative complications.  The patient was  taken to postanesthesia care unit in stable condition.  Earlier, specimen was retrieved by extending the previous camera port site superiorly and inferiorly for a distance of approximately 3 cm, removing the prostatectomy specimen set aside from  pathology.  Extraction site was closed with the fascia using figure-of-eight PDS x 3 followed by reapproximation of Scarpa's with running Vicryl.  All incision sites were infiltrated  with dilute lipolyzed Marcaine and closed at the level skin using  subcuticular Monocryl followed by Dermabond.  Please note first assistant, Debbrah Alar, was crucial for all portions of the surgery today.  She provided invaluable retraction, suctioning, vascular stapling, specimen manipulation, and general first assistance.   VAI D: 10/13/2021 10:56:15 am T: 10/14/2021 12:53:00 am  JOB: [id number]/ [id number]

## 2021-10-14 NOTE — Progress Notes (Signed)
Urology Progress Note   1 Day Post-Op from robotic simple prostatectomy.   Subjective: NAEON.  Vital signs stable.  2.8 L of urine output.  75 cc from [place].  Hemoglobin 11.4 from 13.4 postoperatively.  Ambulated in the hallways.  Has not passed gas yet but has an appetite.  Objective: Vital signs in last 24 hours: Temp:  [97.5 F (36.4 C)-98.9 F (37.2 C)] 98.1 F (36.7 C) (02/16 0530) Pulse Rate:  [74-100] 77 (02/16 0530) Resp:  [9-19] 17 (02/16 0530) BP: (102-163)/(66-104) 110/70 (02/16 0530) SpO2:  [90 %-98 %] 94 % (02/16 0530) Weight:  [113.4 kg] 113.4 kg (02/15 1500)  Intake/Output from previous day: 02/15 0701 - 02/16 0700 In: 3970 [P.O.:720; I.V.:1900; IV Piggyback:1350] Out: 2925 [Urine:2800; Drains:75; Blood:50] Intake/Output this shift: No intake/output data recorded.  Physical Exam:  General: Alert and oriented CV: Regular rate Lungs: No increased work of breathing Abdomen: Soft, appropriately tender. Incisions c/d/i. JP SS GU: Foley in place draining clear watermelon urine in tubing Ext: NT, No erythema  Lab Results: Recent Labs    10/13/21 1127 10/14/21 0432  HGB 13.4 11.4*  HCT 40.5 34.4*   Recent Labs    10/13/21 1127 10/14/21 0432  NA 134* 139  K 4.3 3.7  CL 104 109  CO2 23 25  GLUCOSE 124* 121*  BUN 10 11  CREATININE 1.06 0.81  CALCIUM 8.1* 8.0*    Studies/Results: No results found.  Assessment/Plan:  66 y.o. male s/p robotic simple prostatectomy overall doing well post-op.   -Stop IV fluids - Discontinue JP drain - P.o. as needed analgesics - Ambulate  Dispo: Potentially home today versus tomorrow depending on how patient feels and urine output consistency   LOS: 0 days

## 2021-10-14 NOTE — Progress Notes (Signed)
°  Transition of Care [facility]) Screening Note   Patient Details  Name: Bryan Sosa Date of Birth: 09/07/1955   Transition of Care [facility]) CM/SW Contact:    Dessa Phi, RN Phone Number: 10/14/2021, 10:25 AM    Transition of Care Department [facility]) has reviewed patient and no TOC needs have been identified at this time. We will continue to monitor patient advancement through interdisciplinary progression rounds. If new patient transition needs arise, please place a TOC consult.

## 2021-10-14 NOTE — Discharge Summary (Signed)
Physician Discharge Summary  Patient ID: Bryan Sosa MRN: [id number] DOB/AGE: [birth year] 66 y.o.  Admit date: 10/13/2021 Discharge date: 10/14/2021  Admission Diagnoses: Large Prostate With Obstruction  Discharge Diagnoses:  Principal Problem:   BPH (benign prostatic hyperplasia)   Discharged Condition: good  Hospital Course: Pt underwent robotic simple prostatectomy on 10/13/21, the day of admission, without acute complication. He was observed on the 4th floor Urology service post-op. By the afternoon of POD 1, he is ambulatory, tolerating PO nutrition, pain controlled on PO meds and felt to be adequate for discharge. Path pending at discharge. Hgb 11, Cr <1. He will keep foley at discharge.  Consults: None  Significant Diagnostic Studies: labs: as per above  Treatments: surgery: as per above  Discharge Exam: Blood pressure 110/70, pulse 77, temperature 98.1 F (36.7 C), temperature source Oral, resp. rate 17, height 6' (1.829 m), weight 113.4 kg, SpO2 94 %. NAD, AOx3, family at bedside Non-labored breathing on RA RRR Stable truncal obesity. Recent port / extraction sites c/d/I. Prior JP site with dry dressing. No c/c/e.   Disposition: HOME   Allergies as of 10/14/2021       Reactions   Sulfa Antibiotics Hives        Medication List     STOP taking these medications    aspirin-acetaminophen-caffeine 250-250-65 MG tablet Commonly known as: EXCEDRIN MIGRAINE       TAKE these medications    alfuzosin 10 MG 24 hr tablet Commonly known as: UROXATRAL Take 10 mg by mouth daily with breakfast.   ALPRAZolam 0.5 MG tablet Commonly known as: XANAX TAKE 1/2 TABLET BY MOUTH THREE TIMES DAILY AND 1 AT BEDTIME What changed:  how much to take how to take this when to take this additional instructions   amLODipine 5 MG tablet Commonly known as: NORVASC Take 5 mg by mouth daily.   ciprofloxacin 500 MG tablet Commonly known as: Cipro Take 1 tablet (500 mg  total) by mouth 2 (two) times daily. Start day prior to office visit for foley removal   docusate sodium 100 MG capsule Commonly known as: COLACE Take 1 capsule (100 mg total) by mouth 2 (two) times daily.   DULoxetine 60 MG capsule Commonly known as: CYMBALTA Take 1 capsule (60 mg total) by mouth daily.   HYDROcodone-acetaminophen 5-325 MG tablet Commonly known as: Norco Take 1-2 tablets by mouth every 6 (six) hours as needed for moderate pain or severe pain.   irbesartan 300 MG tablet Commonly known as: AVAPRO Take 300 mg by mouth daily.   metoprolol tartrate 50 MG tablet Commonly known as: LOPRESSOR Take 50 mg by mouth in the morning, at noon, and at bedtime. Morning evening   7 pm and 8 pm  and bedtime  11pm   risperiDONE 1 MG tablet Commonly known as: RISPERDAL Take 1 tablet (1 mg total) by mouth at bedtime.   tadalafil 5 MG tablet Commonly known as: CIALIS Take 5 mg by mouth daily as needed for erectile dysfunction.        Follow-up Information     Alexis Frock, MD Follow up on 10/25/2021.   Specialty: Urology Why: at 11:15 for MD visit, pathology review, and catheter removal. Contact information: [place]  [address] [phone]                 Signed: Alexis Frock 10/14/2021, 1:18 PM

## 2021-10-18 LAB — SURGICAL PATHOLOGY

## 2021-11-12 ENCOUNTER — Other Ambulatory Visit: Payer: Self-pay | Admitting: Physician Assistant

## 2021-11-12 DIAGNOSIS — R251 Tremor, unspecified: Secondary | ICD-10-CM

## 2021-11-13 ENCOUNTER — Other Ambulatory Visit: Payer: Self-pay

## 2021-11-13 ENCOUNTER — Ambulatory Visit
Admission: RE | Admit: 2021-11-13 | Discharge: 2021-11-13 | Disposition: A | Discharge status: Home / Self Care | Payer: 59 | Source: Ambulatory Visit | Attending: Physician Assistant | Admitting: Physician Assistant

## 2021-11-13 DIAGNOSIS — R251 Tremor, unspecified: Secondary | ICD-10-CM

## 2021-11-14 ENCOUNTER — Encounter (HOSPITAL_BASED_OUTPATIENT_CLINIC_OR_DEPARTMENT_OTHER): Payer: Self-pay | Admitting: Emergency Medicine

## 2021-11-14 ENCOUNTER — Emergency Department (HOSPITAL_BASED_OUTPATIENT_CLINIC_OR_DEPARTMENT_OTHER): Payer: 59

## 2021-11-14 ENCOUNTER — Other Ambulatory Visit: Payer: Self-pay

## 2021-11-14 ENCOUNTER — Emergency Department (HOSPITAL_BASED_OUTPATIENT_CLINIC_OR_DEPARTMENT_OTHER)
Admission: EM | Admit: 2021-11-14 | Discharge: 2021-11-14 | Disposition: A | Discharge status: Home / Self Care | Payer: 59 | Attending: Emergency Medicine | Admitting: Emergency Medicine

## 2021-11-14 DIAGNOSIS — I2510 Atherosclerotic heart disease of native coronary artery without angina pectoris: Secondary | ICD-10-CM | POA: Insufficient documentation

## 2021-11-14 DIAGNOSIS — R509 Fever, unspecified: Secondary | ICD-10-CM | POA: Diagnosis not present

## 2021-11-14 DIAGNOSIS — Z20822 Contact with and (suspected) exposure to covid-19: Secondary | ICD-10-CM | POA: Diagnosis not present

## 2021-11-14 DIAGNOSIS — E871 Hypo-osmolality and hyponatremia: Secondary | ICD-10-CM | POA: Diagnosis not present

## 2021-11-14 DIAGNOSIS — R7309 Other abnormal glucose: Secondary | ICD-10-CM | POA: Diagnosis not present

## 2021-11-14 DIAGNOSIS — R319 Hematuria, unspecified: Secondary | ICD-10-CM | POA: Diagnosis present

## 2021-11-14 DIAGNOSIS — N390 Urinary tract infection, site not specified: Secondary | ICD-10-CM | POA: Insufficient documentation

## 2021-11-14 DIAGNOSIS — K5900 Constipation, unspecified: Secondary | ICD-10-CM | POA: Insufficient documentation

## 2021-11-14 DIAGNOSIS — Z79899 Other long term (current) drug therapy: Secondary | ICD-10-CM | POA: Insufficient documentation

## 2021-11-14 DIAGNOSIS — I10 Essential (primary) hypertension: Secondary | ICD-10-CM | POA: Diagnosis not present

## 2021-11-14 LAB — CBC WITH DIFFERENTIAL/PLATELET
Abs Immature Granulocytes: 0.04 10*3/uL (ref 0.00–0.07)
Basophils Absolute: 0.1 10*3/uL (ref 0.0–0.1)
Basophils Relative: 1 %
Eosinophils Absolute: 0.6 10*3/uL — ABNORMAL HIGH (ref 0.0–0.5)
Eosinophils Relative: 6 %
HCT: 36.5 % — ABNORMAL LOW (ref 39.0–52.0)
Hemoglobin: 11.9 g/dL — ABNORMAL LOW (ref 13.0–17.0)
Immature Granulocytes: 0 %
Lymphocytes Relative: 14 %
Lymphs Abs: 1.5 10*3/uL (ref 0.7–4.0)
MCH: 28 pg (ref 26.0–34.0)
MCHC: 32.6 g/dL (ref 30.0–36.0)
MCV: 85.9 fL (ref 80.0–100.0)
Monocytes Absolute: 0.8 10*3/uL (ref 0.1–1.0)
Monocytes Relative: 8 %
Neutro Abs: 7.6 10*3/uL (ref 1.7–7.7)
Neutrophils Relative %: 71 %
Platelets: 293 10*3/uL (ref 150–400)
RBC: 4.25 MIL/uL (ref 4.22–5.81)
RDW: 13.2 % (ref 11.5–15.5)
WBC: 10.6 10*3/uL — ABNORMAL HIGH (ref 4.0–10.5)
nRBC: 0 % (ref 0.0–0.2)

## 2021-11-14 LAB — URINALYSIS, MICROSCOPIC (REFLEX): WBC, UA: >50 WBC/hpf (ref 0–5)

## 2021-11-14 LAB — URINALYSIS, ROUTINE W REFLEX MICROSCOPIC
Glucose, UA: NEGATIVE mg/dL
Ketones, ur: NEGATIVE mg/dL
Nitrite: POSITIVE — AB
Protein, ur: 100 mg/dL — AB
Specific Gravity, Urine: 1.025 (ref 1.005–1.030)
pH: 5.5 (ref 5.0–8.0)

## 2021-11-14 LAB — RESP PANEL BY RT-PCR (FLU A&B, COVID) ARPGX2
Influenza A by PCR: NEGATIVE
Influenza B by PCR: NEGATIVE
SARS Coronavirus 2 by RT PCR: NEGATIVE

## 2021-11-14 LAB — BASIC METABOLIC PANEL
Anion gap: 8 (ref 5–15)
BUN: 14 mg/dL (ref 8–23)
CO2: 26 mmol/L (ref 22–32)
Calcium: 8.5 mg/dL — ABNORMAL LOW (ref 8.9–10.3)
Chloride: 100 mmol/L (ref 98–111)
Creatinine, Ser: 1.12 mg/dL (ref 0.61–1.24)
GFR, Estimated: >60 mL/min (ref >60)
Glucose, Bld: 139 mg/dL — ABNORMAL HIGH (ref 70–99)
Potassium: 3.8 mmol/L (ref 3.5–5.1)
Sodium: 134 mmol/L — ABNORMAL LOW (ref 135–145)

## 2021-11-14 LAB — LACTIC ACID, PLASMA: Lactic Acid, Venous: 1.9 mmol/L (ref 0.5–1.9)

## 2021-11-14 MED ORDER — SODIUM CHLORIDE 0.9 % IV BOLUS
1000.0000 mL | Freq: Once | INTRAVENOUS | Status: AC
  Administered 2021-11-14: 1000 mL via INTRAVENOUS

## 2021-11-14 MED ORDER — CIPROFLOXACIN HCL 500 MG PO TABS: 500.0000 mg | ORAL_TABLET | Freq: Two times a day (BID) | ORAL | 0 refills | Status: DC

## 2021-11-14 MED ORDER — SODIUM CHLORIDE 0.9 % IV SOLN
2.0000 g | Freq: Once | INTRAVENOUS | Status: AC
  Administered 2021-11-14: 2 g via INTRAVENOUS
  Filled 2021-11-14: qty 20

## 2021-11-14 MED ORDER — IOHEXOL 300 MG/ML  SOLN
100.0000 mL | Freq: Once | INTRAMUSCULAR | Status: AC | PRN
  Administered 2021-11-14: 100 mL via INTRAVENOUS

## 2021-11-14 NOTE — Discharge Instructions (Addendum)
Please pick up antibiotics and take as prescribed for the next 1 week. ? ?[facility]/Dr. Tresa Moore is being made aware that you are in the ED today.  It is recommended that you follow-up with them in the next couple of days for further evaluation.  If you have not heard anything from their office by Tuesday afternoon I would recommend calling yourself to schedule an appointment. ? ?We have sent your urine for culture and we will call you in 2 to 3 days time if the antibiotic needs to be changed. ? ?Return to the ED for any new/worsening symptoms.  ?

## 2021-11-14 NOTE — ED Provider Notes (Signed)
?[facility] ?Provider Note ? ? ?CSN: [id number] ?Arrival date & time: 11/14/21  1419 ? ?  ? ?History ? ?Chief Complaint  ?Patient presents with  ? Fever  ? ? ?Bryan Sosa is a 66 y.o. male with PMHx HTN, GERD, and recent prostatectomy (10/13/21) who presents to the ED today with complaint of gradual onset, intermittent, chills, for the past 1 week. Pt also complains of intermittent hematuria for the past 1 month s/p prostatectomy. Wife mentions having a "low grade" fever with tmax 36F. They called his urologist today who advised he come to the ED for further evaluation. Pt denies nausea, vomiting, abdominal pain. Does mention that he hasn't had a BM in a couple of days which is atypical for him - he is not on a stool softener at this time. Not taking any narcotic pain medications. Denies cough, rhinorrhea, sneezing, congestion, SOB, body aches.  ? ?The history is provided by the patient, the spouse and medical records.  ? ?  ? ?Home Medications ?Prior to Admission medications   ?Medication Sig Start Date End Date Taking? Authorizing Provider  ?ciprofloxacin (CIPRO) 500 MG tablet Take 1 tablet (500 mg total) by mouth 2 (two) times daily. 11/14/21  Yes Edwyna Dangerfield, PA-C  ?alfuzosin (UROXATRAL) 10 MG 24 hr tablet Take 10 mg by mouth daily with breakfast.    [provider]  ?ALPRAZolam (XANAX) 0.5 MG tablet TAKE 1/2 TABLET BY MOUTH THREE TIMES DAILY AND 1 AT BEDTIME ?Patient taking differently: Take 0.25-0.5 mg by mouth See admin instructions. 0.25 mg daily (may take an additional second dose if needed), and 0.5 mg at bedtime 06/30/21   Cottle, Billey Co., MD  ?amLODipine (NORVASC) 5 MG tablet Take 5 mg by mouth daily. 08/25/21   [provider]  ?ciprofloxacin (CIPRO) 500 MG tablet Take 1 tablet (500 mg total) by mouth 2 (two) times daily. Start day prior to office visit for foley removal 10/13/21   Debbrah Alar, PA-C  ?docusate sodium (COLACE) 100 MG capsule Take  1 capsule (100 mg total) by mouth 2 (two) times daily. 10/13/21   Debbrah Alar, PA-C  ?DULoxetine (CYMBALTA) 60 MG capsule Take 1 capsule (60 mg total) by mouth daily. 06/30/21   Cottle, Billey Co., MD  ?HYDROcodone-acetaminophen (NORCO) 5-325 MG tablet Take 1-2 tablets by mouth every 6 (six) hours as needed for moderate pain or severe pain. 10/13/21   Debbrah Alar, PA-C  ?irbesartan (AVAPRO) 300 MG tablet Take 300 mg by mouth daily. 07/24/19   [provider]  ?metoprolol tartrate (LOPRESSOR) 50 MG tablet Take 50 mg by mouth in the morning, at noon, and at bedtime. Morning evening   7 pm and 8 pm  and bedtime  11pm 04/25/15   [provider]  ?risperiDONE (RISPERDAL) 1 MG tablet Take 1 tablet (1 mg total) by mouth at bedtime. 06/30/21   Cottle, Billey Co., MD  ?tadalafil (CIALIS) 5 MG tablet Take 5 mg by mouth daily as needed for erectile dysfunction.    [provider]  ?   ? ?Allergies    ?Sulfa antibiotics   ? ?Review of Systems   ?Review of Systems  ?Constitutional:  Positive for chills.  ?HENT:  Negative for congestion, rhinorrhea and sore throat.   ?Respiratory:  Negative for cough and shortness of breath.   ?Gastrointestinal:  Positive for constipation. Negative for abdominal pain, diarrhea, nausea and vomiting.  ?Genitourinary:  Positive for hematuria. Negative for flank pain,  frequency, scrotal swelling and testicular pain.  ?All other systems reviewed and are negative. ? ?Physical Exam ?Updated Vital Signs ?BP 113/71   Pulse 82   Temp 98.5 ?F (36.9 ?C)   Resp 20   Ht 6' (1.829 m)   Wt 113.4 kg   SpO2 95%   BMI 33.91 kg/m?  ?Physical Exam ?Vitals and nursing note reviewed.  ?Constitutional:   ?   Appearance: He is not ill-appearing or diaphoretic.  ?HENT:  ?   Head: Normocephalic and atraumatic.  ?Eyes:  ?   Conjunctiva/sclera: Conjunctivae normal.  ?Cardiovascular:  ?   Rate and Rhythm: Normal rate and regular rhythm.  ?   Pulses: Normal pulses.  ?Pulmonary:  ?   Effort:  Pulmonary effort is normal.  ?   Breath sounds: Normal breath sounds. No wheezing, rhonchi or rales.  ?Abdominal:  ?   General: Abdomen is flat. Bowel sounds are normal.  ?   Palpations: Abdomen is soft.  ?   Tenderness: There is no abdominal tenderness. There is no guarding or rebound.  ?Musculoskeletal:  ?   Cervical back: Neck supple.  ?Skin: ?   General: Skin is warm and dry.  ?Neurological:  ?   Mental Status: He is alert.  ? ? ?ED Results / Procedures / Treatments   ?Labs ?(all labs ordered are listed, but only abnormal results are displayed) ?Labs Reviewed  ?URINALYSIS, ROUTINE W REFLEX MICROSCOPIC - Abnormal; Notable for the following components:  ?    Result Value  ? APPearance TURBID (*)   ? Hgb urine dipstick MODERATE (*)   ? Bilirubin Urine SMALL (*)   ? Protein, ur 100 (*)   ? Nitrite POSITIVE (*)   ? Leukocytes,Ua LARGE (*)   ? All other components within normal limits  ?URINALYSIS, MICROSCOPIC (REFLEX) - Abnormal; Notable for the following components:  ? Bacteria, UA MANY (*)   ? All other components within normal limits  ?CBC WITH DIFFERENTIAL/PLATELET - Abnormal; Notable for the following components:  ? WBC 10.6 (*)   ? Hemoglobin 11.9 (*)   ? HCT 36.5 (*)   ? Eosinophils Absolute 0.6 (*)   ? All other components within normal limits  ?BASIC METABOLIC PANEL - Abnormal; Notable for the following components:  ? Sodium 134 (*)   ? Glucose, Bld 139 (*)   ? Calcium 8.5 (*)   ? All other components within normal limits  ?RESP PANEL BY RT-PCR (FLU A&B, COVID) [id number]  ?URINE CULTURE  ?LACTIC ACID, PLASMA  ? ? ?EKG ?None ? ?Radiology ?CT Abdomen Pelvis W Contrast ? ?Result Date: 11/14/2021 ?CLINICAL DATA:  66 year old male with sepsis and hematuria. Recent prostatectomy for hypertrophy. EXAM: CT ABDOMEN AND PELVIS WITH CONTRAST TECHNIQUE: Multidetector CT imaging of the abdomen and pelvis was performed using the standard protocol following bolus administration of intravenous contrast. RADIATION DOSE  REDUCTION: This exam was performed according to the departmental dose-optimization program which includes automated exposure control, adjustment of the mA and/or kV according to patient size and/or use of iterative reconstruction technique. CONTRAST:  12m OMNIPAQUE IOHEXOL 300 MG/ML  SOLN COMPARISON:  05/14/2021 CT and prior studies FINDINGS: Lower chest: No acute abnormality. LEFT coronary artery atherosclerotic calcifications noted. Hepatobiliary: The liver and gallbladder are unremarkable. There is no evidence of intrahepatic or extrahepatic biliary dilatation. Pancreas: Unremarkable Spleen: Unremarkable Adrenals/Urinary Tract: Small amount of gas in the bladder is noted as well as mild circumferential bladder wall thickening. Punctate nonobstructing RIGHT renal calculi are identified. There  is no evidence hydronephrosis or obstructing urinary calculi. Small probable LEFT renal cysts are again noted. The adrenal glands are unremarkable except for stable 1.5 cm RIGHT adrenal adenoma. Stomach/Bowel: Stomach is within normal limits. Appendix appears normal. No evidence of bowel wall thickening, distention, or inflammatory changes. Vascular/Lymphatic: Aortic atherosclerosis. No enlarged abdominal or pelvic lymph nodes. Reproductive: Prostatectomy changes are noted. A 2 cm curvilinear hyperdense structures along the anterior aspect of prostatectomy bed is of uncertain significance-correlate with recent surgery. No focal abscess is identified. Other: No ascites, focal collection or pneumoperitoneum. Musculoskeletal: No acute or suspicious bony abnormalities are noted. IMPRESSION: 1. Prostatectomy changes with 2 cm curvilinear hyperdense structures along the anterior aspect of the prostatectomy bed, of uncertain significance-correlate with recent surgery. No evidence of focal abscess. 2. Small amount of gas in the bladder with mild circumferential bladder wall thickening. Question recent instrumentation versus  infection. Correlate with urinalysis. 3. Punctate nonobstructing RIGHT renal calculi. 4. Stable RIGHT adrenal adenoma. 5. Coronary artery disease. 6. Aortic Atherosclerosis (ICD10-I70.0). Electronically Signed   By: Dellis Filbert

## 2021-11-14 NOTE — ED Triage Notes (Addendum)
States has been shaking x 1 month and sweating and chills with low grade fever last night. Was instructed by urologist to come to ED for eval due to having blood in urine . S/P prostatectomy 10/13/21 ?

## 2021-11-14 NOTE — ED Notes (Signed)
Pt ambulatory to waiting room. Pt verbalized understanding of discharge instructions.   

## 2021-11-16 LAB — URINE CULTURE: Culture: >=100000 — AB

## 2021-11-17 ENCOUNTER — Telehealth: Payer: Self-pay | Admitting: *Deleted

## 2021-11-17 NOTE — Telephone Encounter (Signed)
Post ED Visit - Positive Culture Follow-up ? ?Culture report reviewed by antimicrobial stewardship pharmacist: ?[facility] ?'[]'$  Elenor Quinones, Pharm.D. ?'[]'$  Heide Guile, Pharm.D., BCPS AQ-ID ?'[]'$  Parks Neptune, Pharm.D., BCPS ?'[]'$  Alycia Rossetti, Pharm.D., BCPS ?'[]'$  [place], Pharm.D., BCPS, AAHIVP ?'[]'$  Legrand Como, Pharm.D., BCPS, AAHIVP ?'[]'$  Salome Arnt, PharmD, BCPS ?'[]'$  Johnnette Gourd, PharmD, BCPS ?'[]'$  Hughes Better, PharmD, BCPS ?'[]'$  Leeroy Cha, PharmD ?'[]'$  Laqueta Linden, PharmD, BCPS ?'[]'$  Albertina Parr, PharmD ? ?[facility] Team ?'[]'$  Leodis Sias, PharmD ?'[]'$  Lindell Spar, PharmD ?'[]'$  Royetta Asal, PharmD ?'[]'$  Graylin Shiver, Rph ?'[]'$  Rema Fendt) Glennon Mac, PharmD ?'[]'$  Arlyn Dunning, PharmD ?'[]'$  Netta Cedars, PharmD ?'[]'$  Dia Sitter, PharmD ?'[]'$  Leone Haven, PharmD ?'[]'$  Gretta Arab, PharmD ?'[]'$  Theodis Shove, PharmD ?'[]'$  Peggyann Juba, PharmD ?'[]'$  Reuel Boom, PharmD ? ? ?Positive urine culture ?Treated with Ciprofloxacin HCL, organism sensitive to the same and no further patient follow-up is required at this time.  Symptom check with symptoms resolving and has F/U with Urology on 11/19/2021 ? ?Ardeen Fillers ?11/17/2021, 9:34 AM ?  ?

## 2021-11-29 ENCOUNTER — Encounter: Payer: Self-pay | Admitting: Neurology

## 2021-12-04 ENCOUNTER — Encounter (HOSPITAL_BASED_OUTPATIENT_CLINIC_OR_DEPARTMENT_OTHER): Payer: Self-pay

## 2021-12-04 ENCOUNTER — Emergency Department (HOSPITAL_BASED_OUTPATIENT_CLINIC_OR_DEPARTMENT_OTHER)
Admission: EM | Admit: 2021-12-04 | Discharge: 2021-12-04 | Disposition: A | Discharge status: Home / Self Care | Payer: 59 | Attending: Emergency Medicine | Admitting: Emergency Medicine

## 2021-12-04 ENCOUNTER — Other Ambulatory Visit: Payer: Self-pay

## 2021-12-04 DIAGNOSIS — N390 Urinary tract infection, site not specified: Secondary | ICD-10-CM

## 2021-12-04 DIAGNOSIS — Z79899 Other long term (current) drug therapy: Secondary | ICD-10-CM | POA: Diagnosis not present

## 2021-12-04 DIAGNOSIS — R300 Dysuria: Secondary | ICD-10-CM | POA: Diagnosis present

## 2021-12-04 DIAGNOSIS — I10 Essential (primary) hypertension: Secondary | ICD-10-CM | POA: Insufficient documentation

## 2021-12-04 LAB — URINALYSIS, ROUTINE W REFLEX MICROSCOPIC
Bilirubin Urine: NEGATIVE
Glucose, UA: NEGATIVE mg/dL
Ketones, ur: NEGATIVE mg/dL
Nitrite: POSITIVE — AB
Protein, ur: 100 mg/dL — AB
Specific Gravity, Urine: >=1.03 (ref 1.005–1.030)
pH: 5.5 (ref 5.0–8.0)

## 2021-12-04 LAB — URINALYSIS, MICROSCOPIC (REFLEX): WBC, UA: >50 WBC/hpf (ref 0–5)

## 2021-12-04 MED ORDER — LIDOCAINE HCL (PF) 1 % IJ SOLN
INTRAMUSCULAR | Status: AC
  Filled 2021-12-04: qty 5

## 2021-12-04 MED ORDER — CEFDINIR 300 MG PO CAPS: 300.0000 mg | ORAL_CAPSULE | Freq: Two times a day (BID) | ORAL | 0 refills | Status: AC

## 2021-12-04 MED ORDER — CEFTRIAXONE SODIUM 1 G IJ SOLR
1.0000 g | Freq: Once | INTRAMUSCULAR | Status: AC
  Administered 2021-12-04: 1 g via INTRAMUSCULAR
  Filled 2021-12-04: qty 10

## 2021-12-04 NOTE — ED Notes (Signed)
Pt discharged to home. Discharge instructions have been discussed with patient and/or family members. Pt verbally acknowledges understanding d/c instructions, and endorses comprehension to checkout at registration before leaving. Pt denies s/s of allergic reaction to abx that were administered ?

## 2021-12-04 NOTE — ED Provider Notes (Signed)
?[facility] ?Provider Note ? ? ?CSN: [id number] ?Arrival date & time: 12/04/21  1119 ? ?  ? ?History ? ?Chief Complaint  ?Patient presents with  ? Dysuria  ? ? ?Bryan Sosa is a 66 y.o. male. ? ? Pt with hx of HTN, GERD, and recent prostatectomy (10/13/21) which since the middle of March has had ongoing dysuria has been seen in the emergency department on 11/14/2021 and at that time had a UTI and started on Cipro which cultures grew back Enterobacter with intermediate resistance to Cipro as well as resistance to Bactrim, Macrobid.  Patient reports his symptoms never really went away so he followed up with urology on 11/22/2021 and at that time he reports he was diagnosed with acute cystitis given a shot of some type of antibiotic but has not had anything since that time.  Now over the last 3 days he has had worsening frequency, urgency and dysuria.  He reports when he urinates it looks like there is pus in his urine.  He however has not had systemic symptoms such as fever, abdominal pain, back pain, nausea or vomiting.  He denies any rashes or lesions on the outside of his penis.  He does not have pus drainage between urinating. ? ?The history is provided by the patient.  ?Dysuria ?Presenting symptoms: dysuria   ? ?  ? ?Home Medications ?Prior to Admission medications   ?Medication Sig Start Date End Date Taking? Authorizing Provider  ?cefdinir (OMNICEF) 300 MG capsule Take 1 capsule (300 mg total) by mouth 2 (two) times daily for 14 days. 12/04/21 12/18/21 Yes Blanchie Dessert, MD  ?alfuzosin (UROXATRAL) 10 MG 24 hr tablet Take 10 mg by mouth daily with breakfast.    [provider]  ?ALPRAZolam (XANAX) 0.5 MG tablet TAKE 1/2 TABLET BY MOUTH THREE TIMES DAILY AND 1 AT BEDTIME ?Patient taking differently: Take 0.25-0.5 mg by mouth See admin instructions. 0.25 mg daily (may take an additional second dose if needed), and 0.5 mg at bedtime 06/30/21   Cottle, Billey Co., MD   ?amLODipine (NORVASC) 5 MG tablet Take 5 mg by mouth daily. 08/25/21   [provider]  ?docusate sodium (COLACE) 100 MG capsule Take 1 capsule (100 mg total) by mouth 2 (two) times daily. 10/13/21   Debbrah Alar, PA-C  ?DULoxetine (CYMBALTA) 60 MG capsule Take 1 capsule (60 mg total) by mouth daily. 06/30/21   Cottle, Billey Co., MD  ?HYDROcodone-acetaminophen (NORCO) 5-325 MG tablet Take 1-2 tablets by mouth every 6 (six) hours as needed for moderate pain or severe pain. 10/13/21   Debbrah Alar, PA-C  ?irbesartan (AVAPRO) 300 MG tablet Take 300 mg by mouth daily. 07/24/19   [provider]  ?metoprolol tartrate (LOPRESSOR) 50 MG tablet Take 50 mg by mouth in the morning, at noon, and at bedtime. Morning evening   7 pm and 8 pm  and bedtime  11pm 04/25/15   [provider]  ?risperiDONE (RISPERDAL) 1 MG tablet Take 1 tablet (1 mg total) by mouth at bedtime. 06/30/21   Cottle, Billey Co., MD  ?tadalafil (CIALIS) 5 MG tablet Take 5 mg by mouth daily as needed for erectile dysfunction.    [provider]  ?   ? ?Allergies    ?Sulfa antibiotics   ? ?Review of Systems   ?Review of Systems  ?Genitourinary:  Positive for dysuria.  ? ?Physical Exam ?Updated Vital Signs ?BP 101/66   Pulse 73  Temp 97.9 ?F (36.6 ?C) (Oral)   Resp 18   Ht 6' (1.829 m)   Wt 113.4 kg   SpO2 95%   BMI 33.91 kg/m?  ?Physical Exam ?Vitals and nursing note reviewed.  ?Constitutional:   ?   General: He is not in acute distress. ?   Appearance: He is well-developed.  ?HENT:  ?   Head: Normocephalic and atraumatic.  ?Eyes:  ?   Conjunctiva/sclera: Conjunctivae normal.  ?   Pupils: Pupils are equal, round, and reactive to light.  ?Cardiovascular:  ?   Rate and Rhythm: Normal rate and regular rhythm.  ?   Heart sounds: No murmur heard. ?Pulmonary:  ?   Effort: Pulmonary effort is normal. No respiratory distress.  ?   Breath sounds: Normal breath sounds. No wheezing or rales.  ?Abdominal:  ?   General: There is  no distension.  ?   Palpations: Abdomen is soft.  ?   Tenderness: There is no abdominal tenderness. There is no right CVA tenderness, left CVA tenderness, guarding or rebound.  ?Musculoskeletal:     ?   General: No tenderness. Normal range of motion.  ?   Cervical back: Normal range of motion and neck supple.  ?Skin: ?   General: Skin is warm and dry.  ?   Findings: No erythema or rash.  ?Neurological:  ?   Mental Status: He is alert and oriented to person, place, and time.  ?Psychiatric:     ?   Behavior: Behavior normal.  ? ? ?ED Results / Procedures / Treatments   ?Labs ?(all labs ordered are listed, but only abnormal results are displayed) ?Labs Reviewed  ?URINALYSIS, ROUTINE W REFLEX MICROSCOPIC - Abnormal; Notable for the following components:  ?    Result Value  ? APPearance CLOUDY (*)   ? Hgb urine dipstick LARGE (*)   ? Protein, ur 100 (*)   ? Nitrite POSITIVE (*)   ? Leukocytes,Ua MODERATE (*)   ? All other components within normal limits  ?URINALYSIS, MICROSCOPIC (REFLEX) - Abnormal; Notable for the following components:  ? Bacteria, UA MANY (*)   ? All other components within normal limits  ?URINE CULTURE  ? ? ?EKG ?None ? ?Radiology ?No results found. ? ?Procedures ?Procedures  ? ? ?Medications Ordered in ED ?Medications  ?cefTRIAXone (ROCEPHIN) injection 1 g (has no administration in time range)  ? ? ?ED Course/ Medical Decision Making/ A&P ?  ?                        ?Medical Decision Making ?Amount and/or Complexity of Data Reviewed ?External Data Reviewed: notes. ?Labs: ordered. Decision-making details documented in ED Course. ? ?Risk ?Prescription drug management. ? ? ?Patient is a 66 year old male presenting today with symptoms concerning for UTI.  He is having dysuria, frequency and urgency.  This is all been present for the last 4 weeks.  He has received some IM antibiotics that he is not certain of and took a 1 week course of Cipro but reports symptoms have not completely resolved.  Concerned  that patient has persistent infection due to resistant organism.  He did grow out Enterobacter that appeared to have multiple resistance based on urine culture on 11/14/2021.  Patient's external medical records were reviewed from his recent prostatectomy.  He did follow-up in the urology office which those results and records are not available but he reported getting an IM dose of antibiotic while he was  there.  For the last 2 weeks he has not been on any antibiotic and his symptoms are worsening.  He is not displaying symptoms of sepsis today there is no tachycardia, fever.  He has no abdominal pain or flank pain.  We will repeat urine today discussed with the pharmacist on recommended treatment based on past cultures. ? ?12:56 PM ?I independently interpreted patient's labs and UA is consistent with another UTI.  Spoke with the pharmacist for appropriate antibiotic selection given multiple resistances and he recommended IM 1 g of Rocephin here and then patient will be discharged home on 14 days of cefdinir 300 mg twice daily.  Findings were discussed with the patient and his wife.  They will follow-up with urology.  He was given return precautions for any type of systemic symptoms that may occur. ? ? ? ? ? ? ? ?Final Clinical Impression(s) / ED Diagnoses ?Final diagnoses:  ?Complicated UTI (urinary tract infection)  ? ? ?Rx / DC Orders ?ED Discharge Orders   ? ?      Ordered  ?  cefdinir (OMNICEF) 300 MG capsule  2 times daily       ? 12/04/21 1255  ? ?  ?  ? ?  ? ? ?  ?Blanchie Dessert, MD ?12/04/21 1256 ? ?

## 2021-12-04 NOTE — Discharge Instructions (Addendum)
You were given a shot of ceftriaxone(Rocephin) in the emergency room today and were sent home with 14 days of antibiotic which will hopefully get rid of this urinary tract infection.  If for some reason your cultures come back with something different someone will contact you.  However if you develop fever, pain, nausea or vomiting you should go to the hospital. ?

## 2021-12-04 NOTE — ED Triage Notes (Signed)
Had prostectomy February 15th. States having burning with urination & urgency since yesterday, states looks like there was pus in urine. Has recently been on antibiotics for UTI.  ?

## 2021-12-06 LAB — URINE CULTURE: Culture: >=100000 — AB

## 2021-12-07 ENCOUNTER — Telehealth: Payer: Self-pay | Admitting: *Deleted

## 2021-12-07 NOTE — Telephone Encounter (Signed)
Post ED Visit - Positive Culture Follow-up: Unsuccessful Patient Follow-up ? ?Culture assessed and recommendations reviewed by: ? ?'[]'$  Elenor Quinones, Pharm.D. ?'[]'$  Heide Guile, Pharm.D., BCPS AQ-ID ?'[]'$  Parks Neptune, Pharm.D., BCPS ?'[]'$  Alycia Rossetti, Pharm.D., BCPS ?'[]'$  [place], Pharm.D., BCPS, AAHIVP ?'[]'$  Legrand Como, Pharm.D., BCPS, AAHIVP ?'[]'$  Wynell Balloon, PharmD ?'[]'$  Vincenza Hews, PharmD, BCPS ? ?Positive urine culture ? ?'[]'$  Patient discharged without antimicrobial prescription and treatment is now indicated ?'[x]'$  Organism is resistant to prescribed ED discharge antimicrobial ?'[]'$  Patient with positive blood cultures ? ?Plan:  Stop Cefdinir.  Start Levofloxacin '500mg'$  PO QD x 10 days, Sherwood Gambler, MD ? ?Unable to contact patient after 3 attempts, letter will be sent to address on file ? ?Bryan Sosa ?12/07/2021, 9:21 AM  ?

## 2021-12-14 ENCOUNTER — Telehealth: Payer: Self-pay | Admitting: *Deleted

## 2021-12-14 NOTE — Telephone Encounter (Signed)
New prescription called to [facility] [phone]) Levofloxacin '500mg'$  PO QD x 10 days.  Patient notified. ?

## 2022-01-03 ENCOUNTER — Other Ambulatory Visit: Payer: Self-pay | Admitting: Psychiatry

## 2022-01-03 DIAGNOSIS — F5105 Insomnia due to other mental disorder: Secondary | ICD-10-CM

## 2022-01-03 DIAGNOSIS — F4010 Social phobia, unspecified: Secondary | ICD-10-CM

## 2022-01-03 DIAGNOSIS — F429 Obsessive-compulsive disorder, unspecified: Secondary | ICD-10-CM

## 2022-01-04 NOTE — Progress Notes (Signed)
? ?NEUROLOGY CONSULTATION NOTE ? ?Bryan Sosa ?MRN: [id number] ?DOB: Jan 07, 1956 ? ?Referring provider: Maury Dus, MD ?Primary care provider: Maury Dus, MD ? ?Reason for consult:  abnormal brain MRI ? ?Assessment/Plan:  ? ?Parkinson's disease based on exam and history (anosmia, REM sleep behavior disorder).  He does take risperidone which may cause extrapyramidal symptoms, but it is such a low dose.   ?I suspect that the white matter abnormalities on brain MRI represent chronic small vessel ischemic changes rather than demyelinating disease. ? ?Initiate carbidopa-levodopa titrating to 25/'100mg'$  three times daily ?Advised to start ASA '81mg'$  daily ?Encourage routine exercise ?Provided information about PD and support groups. ?Follow up 4 months. ? ? ? ?Subjective:  ?Bryan Sosa is a 66 year old left handed male with HTN and migraines who presents for abnormal brain MRI.  History supplemented by referring provider's note.  MRI of brain from 09/23/2010 and 11/13/2021 personally reviewed.  He is accompanied by his wife who also supplements history. ? ?About 5-6 months ago, he started noticing tremor in his left hand.  It is difficult when he is brushing his teeth or holding utensils.  He started having slurred speech and difficulty speaking as well.  Some mild trouble swallowing.  He moves slower.  It takes a little more time to stand and straighten himself out of a chair.  However he denies freezing or gait instability.  No double vision or weakness.   He also reports short term memory problems.  It takes longer to collect his thoughts on what to say.  MRI of brain without contrast on 11/13/2021 showed nonspecific bilateral periventricular white matter T2/FLAIR foci increased compared to prior MRI from 2012.  Differential diagnosis on radiology report raised concern for demyelinating disease.  He reports that he has had poor sense of smell for many years.  Also, he has a history of having vivid dreams and sometimes  acting out in his sleep.  It was noticeable after starting new medications, such as blood pressure medication, but not so much now.  He also endorses erectile dysfunction.  There is a family history of dementia in both parents and his uncle had Lewy Body Dementia.   ? ?09/23/2010 MRI BRAIN WO:  1.  Periventricular white matter changes.  These are nonspecific, but can be seen and myelination process such as multiple sclerosis.  Microvascular ischemic changes can give a similar appearance. Similar findings can be seen in the setting of chronic migraine headaches or with a vasculitis. 2.  No acute intracranial abnormality.  ?11/13/2021 MRI BRAIN WO:  1. Foci of FLAIR signal abnormality in the periventricular white matter bilaterally are increased in extent since 2012. These lesions are nonspecific, but the configuration raises suspicion for ?demyelination specifically multiple sclerosis. Differential includes ?sequela of chronic small vessel ischemia, vasculitis, among other etiologies.  2. Otherwise, no evidence of acute intracranial pathology. ?  ? ? ?PAST MEDICAL HISTORY: ?Past Medical History:  ?Diagnosis Date  ? Anxiety   ? Depression   ? ED (erectile dysfunction)   ? GERD (gastroesophageal reflux disease)   ? Headache   ? migraines  ? History of kidney stones   ? Hypertension   ? Insomnia   ? PVC (premature ventricular contraction)   ? ? ?PAST SURGICAL HISTORY: ?Past Surgical History:  ?Procedure Laterality Date  ? bladder neck obstruction    ? 2008  ? XI ROBOTIC ASSISTED SIMPLE PROSTATECTOMY N/A 10/13/2021  ? Procedure: XI ROBOTIC ASSISTED SIMPLE PROSTATECTOMY;  Surgeon: Tresa Moore,  Hubbard Robinson, MD;  Location: [facility];  Service: Urology;  Laterality: N/A;  ? ? ?MEDICATIONS: ?Current Outpatient Medications on File Prior to Visit  ?Medication Sig Dispense Refill  ? alfuzosin (UROXATRAL) 10 MG 24 hr tablet Take 10 mg by mouth daily with breakfast.    ? ALPRAZolam (XANAX) 0.5 MG tablet TAKE 1/2 TABLET BY MOUTH 3 TIMES DAILY  AND 1 AT BEDTIME 75 tablet 2  ? amLODipine (NORVASC) 5 MG tablet Take 5 mg by mouth daily.    ? docusate sodium (COLACE) 100 MG capsule Take 1 capsule (100 mg total) by mouth 2 (two) times daily.    ? DULoxetine (CYMBALTA) 60 MG capsule Take 1 capsule (60 mg total) by mouth daily. 90 capsule 2  ? HYDROcodone-acetaminophen (NORCO) 5-325 MG tablet Take 1-2 tablets by mouth every 6 (six) hours as needed for moderate pain or severe pain. 20 tablet 0  ? irbesartan (AVAPRO) 300 MG tablet Take 300 mg by mouth daily.    ? metoprolol tartrate (LOPRESSOR) 50 MG tablet Take 50 mg by mouth in the morning, at noon, and at bedtime. Morning evening   7 pm and 8 pm  and bedtime  11pm    ? risperiDONE (RISPERDAL) 1 MG tablet Take 1 tablet (1 mg total) by mouth at bedtime. 90 tablet 1  ? tadalafil (CIALIS) 5 MG tablet Take 5 mg by mouth daily as needed for erectile dysfunction.    ? ?No current facility-administered medications on file prior to visit.  ? ? ?ALLERGIES: ?Allergies  ?Allergen Reactions  ? Sulfa Antibiotics Hives  ? ? ?FAMILY HISTORY: ?Family History  ?Problem Relation Age of Onset  ? Ulcers Mother   ? Heart attack Father   ? ? ?Objective:  ?Blood pressure 130/79, pulse 73, resp. rate (!) 73, height 6' (1.829 m), weight 248 lb 12.8 oz (112.9 kg), SpO2 98 %. ?General: No acute distress.  Patient appears well-groomed.   ?Head:  Normocephalic/atraumatic ?Eyes:  fundi examined but not visualized ?Neck: supple, no paraspinal tenderness, full range of motion ?Back: No paraspinal tenderness ?Heart: regular rate and rhythm ?Lungs: Clear to auscultation bilaterally. ?Vascular: No carotid bruits. ?Neurological Exam: ?Mental status: alert and oriented to person, place, and time, speech fluent and not dysarthric, language intact. ? ?  01/05/2022  ? 11:00 AM  ?[employer] Mental Exam  ?Weekday Correct 1  ?Current year 1  ?What state are we in? 1  ?Amount spent 1  ?Amount left 0  ?# of Animals 2  ?5 objects recall 1  ?Number  series 2  ?Hour markers 2  ?Time correct 0  ?Placed X in triangle correctly 0  ?Largest Figure 1  ?Name of male 2  ?Date back to work 0  ?Type of work 2  ?State she lived in 0  ?Total score 16  ? ?Cranial nerves: ?CN I: not tested ?CN II: pupils equal, round and reactive to light, visual fields intact ?CN III, IV, VI:  full range of motion, no nystagmus, no ptosis ?CN V: facial sensation intact. ?CN VII: upper and lower face symmetric.  Hypomimia ?CN VIII: hearing intact ?CN IX, X: gag intact, uvula midline, hypophonia ?CN XI: sternocleidomastoid and trapezius muscles intact ?CN XII: tongue midline ?Bulk & Tone: slight increased tone in elbows and left wrist but no cogwheel rigidity; no fasciculations. ?Motor:  muscle strength 5/5 throughout.  Reduced finger-thumb tapping speech and amplitude.  Micrographia.  Resting tremor.  Kinetic and postural hand tremors absent. ?Sensation:  temperature and  vibratory sensation intact. ?Deep Tendon Reflexes:  2+ throughout,  toes downgoing.   ?Finger to nose testing:  Without dysmetria.   ?Heel to shin:  Without dysmetria.   ?Gait:  Normal station and stride with mildly reduced left arm swing..  Romberg negative. ? ? ? ?Thank you for allowing me to take part in the care of this patient. ? ?Metta Clines, DO ? ?CC: Maury Dus, MD ? ? ? ? ?

## 2022-01-05 ENCOUNTER — Encounter: Payer: Self-pay | Admitting: Neurology

## 2022-01-05 ENCOUNTER — Ambulatory Visit (INDEPENDENT_AMBULATORY_CARE_PROVIDER_SITE_OTHER): Payer: Medicare Other | Admitting: Neurology

## 2022-01-05 VITALS — BP 130/79 | HR 73 | Resp 73 | Ht 72.0 in | Wt 248.8 lb

## 2022-01-05 DIAGNOSIS — I679 Cerebrovascular disease, unspecified: Secondary | ICD-10-CM | POA: Diagnosis not present

## 2022-01-05 DIAGNOSIS — G20 Parkinson's disease: Secondary | ICD-10-CM

## 2022-01-05 DIAGNOSIS — G20A1 Parkinson's disease without dyskinesia, without mention of fluctuations: Secondary | ICD-10-CM

## 2022-01-05 MED ORDER — CARBIDOPA-LEVODOPA 25-100 MG PO TABS: ORAL_TABLET | ORAL | 0 refills | Status: DC

## 2022-01-05 NOTE — Patient Instructions (Signed)
I think the MRI findings represent vascular changes in the brain, not multiple sclerosis ? ?1. Start carbidopa (Sinemet) 25/'100mg'$  tablets.  ?Week 1: Take 0.5 tablet in morning and 0.5 tablet in evening/bedtime.  ?Week 2: Take 0.5 tablet in morning, 0.5 tablet in afternoon, and 0.5 tablet in evening/bedtime.  ?Week 3 & thereafter: Take 1 tablet three times daily.  ? ?Take the medication at the same time everyday. The medication does not get absorbed into your body as well, if you take it with protein-containing foods (meat, dairy, beans). Try taking the medication about one hour before meals. If you experience nausea by taking it on an empty stomach, you may take it with carbohydrate-containing food,such as bread or crackers. ?Side effects to look out for, include dizziness, nausea, vivid dreams and hallucinations. If you experience any of these symptoms, please call us. ? ?2.  Important to exercise regularly such as walking, going to the gym, etc ?3.  Start taking an aspirin '81mg'$  daily ?4.  Follow up 4 months. ? ? ?Parkinson's Disease ?Parkinson's disease causes problems with movements. It makes it harder for you to walk or control your body. It is a long-term condition. It gets worse over time. ?What are the causes? ?This condition is caused by a loss of brain cells called neurons. These brain cells make a chemical called dopamine, which is needed to control body movement. It is not known what causes the brain cells to die. ?What increases the risk? ?Being male. ?Being age 1 or older. ?Having family members who had Parkinson's disease. ?Having had an injury to the brain. ?Being around things that are harmful or poisonous, such as pesticides. ?Being very sad (depressed). ?What are the signs or symptoms? ?Symptoms of this condition can vary. The main symptoms can be seen in your movement. These include: ?Shaking or tremors that you cannot control. This happens while you are resting. ?Stiffness in your neck, arms,  and legs. ?Trouble making small movements that are needed to button your clothing or brush your teeth. ?Losing facial expressions. ?Walking in a way that is not normal. You may walk with short, shuffling steps. ?Loss of balance when standing. You may sway, fall backward, or have trouble making turns. ?Other symptoms include: ?Being very sad or worried. ?Having false beliefs (delusions). ?Seeing, hearing, or feeling things that are not real. ?Trouble speaking or swallowing. ?Having a hard time pooping (constipation). ?Needing to pee right away, peeing often, or not being able to control when you pee or poop. ?Sleep problems. ?How is this treated? ?There is no cure for this disease. The goal of treatment is to manage your symptoms. Treatment may include: ?Medicines. ?Therapy to help with talking or movement. ?Surgery to reduce shaking and other movements that you cannot control. ?Follow these instructions at home: ?Medicines ?Take over-the-counter and prescription medicines only as told by your doctor. ?Avoid taking pain or sleeping medicines. These medicines affect your thinking. ?Eating and drinking ?Follow instructions from your doctor about what you cannot eat or drink. ?Do not drink alcohol. ?Activity ?Ask your doctor if it is safe for you to drive. ?Do exercises as told by your doctor. ?Lifestyle ? ?Put in grab bars and railings in your home, especially in the toilet and shower. These help to prevent falls. ?Do not smoke or use any products that contain nicotine or tobacco. If you need help quitting, ask your doctor. ?Join a support group. ?General instructions ?Talk with your doctor to know the type of help that  you need at home. Ask your doctor about ways to stay safe. ?Keep all follow-up visits. These include going to see a physical therapist, speech therapist, or occupational therapist. ?Where to find more information ?[employer] Neurological Disorders and Stroke: MasterBoxes.it ?Parkinson's  Foundation: www.parkinson.org ?Contact a doctor if: ?Medicines do not help your symptoms. ?You keep losing your balance. ?You fall at home. ?You need more help at home. ?You have trouble swallowing. ?You have a very hard time pooping. ?You have a lot of side effects from your medicines. ?You feel very sad, worried, or confused. ?You see, hear, or feel things that are not real. ?Get help right away if: ?You were hurt in a fall. ?You cannot swallow without choking. ?You have chest pain or trouble breathing. ?You do not feel safe at home. ?You have thoughts about hurting yourself or other people. ?These symptoms may be an emergency. Get help right away. Call your local emergency services (911 in the [place] ?Do not wait to see if the symptoms will go away. ?Do not drive yourself to the hospital. ?Get help right away if you feel like you may hurt yourself or others, or have thoughts about taking your own life. Go to your nearest emergency room or: ?Call your local emergency services (911 in the [place] ?Call the [place] at (819)358-1253 or 988 in the [place] This is open 24 hours a day. ?Text the Crisis Text Line at 207-578-6248. ?Summary ?Parkinson's disease causes problems with movements. ?It is a long-term condition. It gets worse over time. ?There is no cure. Treatment focuses on managing your symptoms. ?Talk with your doctor to know the type of help that you need at home. Ask your doctor about ways to stay safe. ?Keep all follow-up visits. ?This information is not intended to replace advice given to you by your health care provider. Make sure you discuss any questions you have with your health care provider. ?Document Revised: 03/10/2021 Document Reviewed: 11/30/2020 ?Elsevier Patient Education ? [employer]. ? ? ? ? ?

## 2022-02-03 DIAGNOSIS — Z0289 Encounter for other administrative examinations: Secondary | ICD-10-CM

## 2022-02-10 ENCOUNTER — Other Ambulatory Visit: Payer: Self-pay | Admitting: Neurology

## 2022-03-14 ENCOUNTER — Other Ambulatory Visit: Payer: Self-pay | Admitting: Neurology

## 2022-03-30 ENCOUNTER — Ambulatory Visit (INDEPENDENT_AMBULATORY_CARE_PROVIDER_SITE_OTHER): Payer: Medicare Other | Admitting: Psychiatry

## 2022-03-30 ENCOUNTER — Encounter: Payer: Self-pay | Admitting: Psychiatry

## 2022-03-30 DIAGNOSIS — F4010 Social phobia, unspecified: Secondary | ICD-10-CM

## 2022-03-30 DIAGNOSIS — F429 Obsessive-compulsive disorder, unspecified: Secondary | ICD-10-CM

## 2022-03-30 DIAGNOSIS — F5105 Insomnia due to other mental disorder: Secondary | ICD-10-CM | POA: Diagnosis not present

## 2022-03-30 MED ORDER — TRAZODONE HCL 50 MG PO TABS: 50.0000 mg | ORAL_TABLET | Freq: Every day | ORAL | 1 refills | Status: DC

## 2022-03-30 MED ORDER — DULOXETINE HCL 60 MG PO CPEP: 60.0000 mg | ORAL_CAPSULE | Freq: Every day | ORAL | 3 refills | Status: DC

## 2022-03-30 NOTE — Progress Notes (Signed)
Bryan Sosa [id number] 04-Aug-1956 66 y.o.  Subjective:   Patient ID:  Bryan Sosa is a 66 y.o. (DOB [birth year]) male.  Chief Complaint:  Chief Complaint  Patient presents with   Follow-up    Obsessive-compulsive disorder with good or fair insight   Anxiety   Sleeping Problem   Stress    sciatica    HPI Bryan Sosa presents to the office today for follow-up of OCD.  seen in december 2020.  No meds were changed.  04/16/20 appt with following noted: Still on meds including Xanax 0.5 mg tab 1/2 tab TID and 1 tab HS, risperidone 1/2 mg HS and duloxetine 60 mg daily. About the same.  OK I guess and not worse.  Anxious from time to time. Wonders if meds cause bad dreams, but more often vivid dreams.  No problems with the meds.  Consistent with meds.  Usually sleep OK. OCD under good enough control. No longer problems with sleepiness.  Anxiety is good overall.  No sig problems with intrusive or obsessive thoughts now with the medicines.  Satisfied with meds.  Still dreams of working at [employer].  Helped to get OOB earlier with reduction Risperidone to 0.5 mg HS.  Likes having the 1 mg tablets.  Consistent with psych meds.  OCD is managed mostly.   Sporadically working at [employer].  Gained 20 # but inactive more. Reduced risperidone from 1 to 1/2 mg Hs did reduce sleepiness.   No worsening of anxiety after the change.  Still has anxiety at times, no worse nor better.  No depression.  Sleep at least 8 hours. In bed 10 + hours bc retired.   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  Has retired. Plan: No further med changes  10/15/2020 appointment with the following noted: He stopped risperidone about 03/2020 without difficulty except a little trouble with sleep awakening.   Asked Dr. Mariea Clonts about this and excessive sleepiness. Taking alprazolam 0.25 mg TID and 0.5 mg HS.Marland Kitchen They are wondering about OSA.   To bed 1230 and up 930.  Toss and turn.  Wife says he snores.  He plans  to go back there if continues with this problem. Doing fine.  Joined gym 2 hours day and 5 days per week helping mood. Not sure about depression.  Residual anxiety and would like to continue meds. No avoidance as far as he knows. Weight about 241#.  Questions about meds and weight gain. Plan: No med changes. Continue alprazolam 0.5 mg half 3 times daily and 1 nightly prn, risperidone 1 mg nightly, duloxetine 60 mg daily.  06/30/2021 appointment with the following noted: Usually alprazolam 0.25 mg am and 0.5 mg HS. Good overall.  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  Pleased. Anxiety mostly OK.  Occ bouts are less often.  Time to time obs but nowhere near as bad as it was. Noticed positional tremor resting.   No sleep study. Went off risperidone for 10 days and starting having sleep problems so restarted it.   Not depressed.  Trouble staying asleep at times.  Might nap 2 days per week for 60-90 mins and other days 2 hour naps.  Then in bed 9 hours.  Doesn't think his sleep is that good.  May go to bed out of boredom. 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 D pregnant and due Dec with first grandchild. He stopped and restarted risperidone.   Plan: He doesn't want further med changes.  Continue duloxetine 60, risperidone 1, alprazolam prn.  03/30/2022 appointment noted: Sciatica on R and using crutches for 3 weeks.   Has been to doctor and again on Monday. Dx PD May 2023. Dr Loretta Plume. Started Sinemet.  Not helped so far. Reduced risperidone to 1/2 tablet at night and still on duloxetine 60 mg daily, still taking alprazolam 0.25 mg AM and 0.5 mg HS Didn't want to stop risperidone bc helps with sleep some.  Was sleeping good before risperidone. Never completely stopped it. Anxiety and OCD is no worse than last time and relatively controlled for the last couple of years.    Past Psychiatric Medication Trials: Sertraline diarrhea, fluvoxamine sedation, fluoxetine cognitive side effects,  paroxetine side effects,  risperidone 1 mg,  Xanax Duloxetine 60 mg since September 2019  Review of Systems:  Review of Systems  Constitutional:  Negative for unexpected weight change.  Cardiovascular:  Negative for palpitations.  Musculoskeletal:  Positive for back pain and gait problem.  Neurological:  Positive for tremors. Negative for weakness.  Psychiatric/Behavioral:  Positive for sleep disturbance. Negative for agitation, behavioral problems, confusion, decreased concentration, dysphoric mood, hallucinations, self-injury and suicidal ideas. The patient is not nervous/anxious and is not hyperactive.     Medications: I have reviewed the patient's current medications.  Current Outpatient Medications  Medication Sig Dispense Refill   ALPRAZolam (XANAX) 0.5 MG tablet TAKE 1/2 TABLET BY MOUTH 3 TIMES DAILY AND 1 AT BEDTIME (Patient taking differently: 1 IN AM AND 1 AT BEDTIME) 75 tablet 2   amLODipine (NORVASC) 5 MG tablet Take 5 mg by mouth daily.     aspirin-acetaminophen-caffeine (EXCEDRIN MIGRAINE) 250-250-65 MG tablet 2 tablets as needed for pain     carbidopa-levodopa (SINEMET IR) 25-100 MG tablet 1 TABLET BY MOUTH 3 TIMES DAILY 90 tablet 0   finasteride (PROSCAR) 5 MG tablet Take 5 mg by mouth daily.     irbesartan (AVAPRO) 300 MG tablet Take 300 mg by mouth daily.     metoprolol tartrate (LOPRESSOR) 50 MG tablet Take 50 mg by mouth in the morning, at noon, and at bedtime. Morning evening   7 pm and 8 pm  and bedtime  11pm     tadalafil (CIALIS) 5 MG tablet Take 5 mg by mouth daily as needed for erectile dysfunction.     traZODone (DESYREL) 50 MG tablet Take 1 tablet (50 mg total) by mouth at bedtime. 30 tablet 1   alfuzosin (UROXATRAL) 10 MG 24 hr tablet Take 10 mg by mouth daily with breakfast. (Patient not taking: Reported on 03/30/2022)     DULoxetine (CYMBALTA) 60 MG capsule Take 1 capsule (60 mg total) by mouth daily. 90 capsule 3   No current facility-administered  medications for this visit.    Medication Side Effects: None  Allergies:  Allergies  Allergen Reactions   Sulfa Antibiotics Hives    Past Medical History:  Diagnosis Date   Anxiety    Depression    ED (erectile dysfunction)    GERD (gastroesophageal reflux disease)    Headache    migraines   History of kidney stones    Hypertension    Insomnia    PVC (premature ventricular contraction)     Family History  Problem Relation Age of Onset   Dementia Mother    Ulcers Mother    Heart attack Father    Dementia Maternal Aunt    Dementia Paternal Uncle     Social History   Socioeconomic History   Marital  status: Married    Spouse name: Not on file   Number of children: Not on file   Years of education: Not on file   Highest education level: Not on file  Occupational History   Not on file  Tobacco Use   Smoking status: Never   Smokeless tobacco: Never  Vaping Use   Vaping Use: Never used  Substance and Sexual Activity   Alcohol use: No   Drug use: No   Sexual activity: Not Currently  Other Topics Concern   Not on file  Social History Narrative   Left handed   Social Determinants of Health   Financial Resource Strain: Not on file  Food Insecurity: Not on file  Transportation Needs: Not on file  Physical Activity: Not on file  Stress: Not on file  Social Connections: Not on file  Intimate Partner Violence: Not on file    Past Medical History, Surgical history, Social history, and Family history were reviewed and updated as appropriate.   Please see review of systems for further details on the patient's review from today.   Objective:   Physical Exam:  There were no vitals taken for this visit.  Physical Exam Constitutional:      General: He is not in acute distress.    Appearance: Normal appearance. He is well-developed.  Musculoskeletal:        General: No deformity.  Neurological:     Mental Status: He is alert and oriented to person, place,  and time.     Motor: No tremor.     Coordination: Coordination normal.     Gait: Gait normal.  Psychiatric:        Attention and Perception: Attention and perception normal.        Mood and Affect: Mood is anxious. Mood is not depressed. Affect is blunt. Affect is not labile, angry or inappropriate.        Speech: Speech is not delayed.        Behavior: Behavior is not slowed.        Thought Content: Thought content normal. Thought content does not include homicidal or suicidal ideation. Thought content does not include homicidal or suicidal plan.        Cognition and Memory: Cognition normal.        Judgment: Judgment normal.     Comments: Insight intact. No auditory or visual hallucinations. No delusions. Chronic scrupulosity obsessions but under good control      Lab Review:     Component Value Date/Time   NA 134 (L) 11/14/2021 1455   K 3.8 11/14/2021 1455   CL 100 11/14/2021 1455   CO2 26 11/14/2021 1455   GLUCOSE 139 (H) 11/14/2021 1455   BUN 14 11/14/2021 1455   CREATININE 1.12 11/14/2021 1455   CALCIUM 8.5 (L) 11/14/2021 1455   PROT 7.2 03/08/2014 2005   ALBUMIN 3.9 03/08/2014 2005   AST 17 03/08/2014 2005   ALT 16 03/08/2014 2005   ALKPHOS 59 03/08/2014 2005   BILITOT 0.3 03/08/2014 2005   GFRNONAA >60 11/14/2021 1455   GFRAA >60 03/28/2017 0820       Component Value Date/Time   WBC 10.6 (H) 11/14/2021 1455   RBC 4.25 11/14/2021 1455   HGB 11.9 (L) 11/14/2021 1455   HCT 36.5 (L) 11/14/2021 1455   PLT 293 11/14/2021 1455   MCV 85.9 11/14/2021 1455   MCH 28.0 11/14/2021 1455   MCHC 32.6 11/14/2021 1455   RDW 13.2 11/14/2021 1455  LYMPHSABS 1.5 11/14/2021 1455   MONOABS 0.8 11/14/2021 1455   EOSABS 0.6 (H) 11/14/2021 1455   BASOSABS 0.1 11/14/2021 1455    No results found for: "POCLITH", "LITHIUM"   No results found for: "PHENYTOIN", "PHENOBARB", "VALPROATE", "CBMZ"   .res Assessment: Plan:    Raford was seen today for follow-up, anxiety, sleeping  problem and stress.  Diagnoses and all orders for this visit:  Obsessive-compulsive disorder with good or fair insight -     DULoxetine (CYMBALTA) 60 MG capsule; Take 1 capsule (60 mg total) by mouth daily.  Social anxiety disorder -     DULoxetine (CYMBALTA) 60 MG capsule; Take 1 capsule (60 mg total) by mouth daily.  Insomnia due to mental condition -     traZODone (DESYREL) 50 MG tablet; Take 1 tablet (50 mg total) by mouth at bedtime.  Medication sensitive.  Disc usual course and response of OCD, scrupulosity with meds and unlikely to fully resolve and need for chronic treatment.  Also usually need max dosages of SSRI but he's med sensitive. Importance of activity with retirement otherwise anxiety might be worse but he's done OK with anxiety but limited activity.. Encourage activiity .  He stays busy with volunteering at church and other places.  Discussed potential metabolic side effects associated with atypical antipsychotics, as well as potential risk for movement side effects. Advised pt to contact office if movement side effects occur.  Will DC risperidone DT PD. Call if anxiety gets worse.  Asked questions about PD including risk psychosis, SE meds.  And about sx.  He needs the alprazolam still.  Disc option wean gradually.  Disc importance activity to keep mental health.    Watch caffeine and sleep.  Sleep hygiene.  Spending too much time in bed. Sleep restriction.  We discussed the short-term risks associated with benzodiazepines including sedation and increased fall risk among others.  Discussed long-term side effect risk including dependence, potential withdrawal symptoms, and the potential eventual dose-related risk of dementia.  Ok to continue low dose alprazolam.  Continue duloxetine 60 DC risperidone DT dx PD and tremor For sleep trazodone 50 mg HS Ok to continue low dose alprazolam  Fu 6 mos  Lynder Parents, MD, DFAPA    Please see After Visit Summary for  patient specific instructions.  Future Appointments  Date Time Provider [place]  04/04/2022  9:15 AM Lorine Bears, NP [facility] None  06/01/2022  1:50 PM Pieter Partridge, DO [facility] None  10/03/2022  4:30 PM Cottle, Billey Co., MD [facility] None    No orders of the defined types were placed in this encounter.     -------------------------------

## 2022-04-04 ENCOUNTER — Ambulatory Visit (INDEPENDENT_AMBULATORY_CARE_PROVIDER_SITE_OTHER): Payer: Medicare Other | Admitting: Physical Medicine and Rehabilitation

## 2022-04-04 ENCOUNTER — Encounter: Payer: Self-pay | Admitting: Physical Medicine and Rehabilitation

## 2022-04-04 ENCOUNTER — Ambulatory Visit (INDEPENDENT_AMBULATORY_CARE_PROVIDER_SITE_OTHER): Payer: Medicare Other

## 2022-04-04 VITALS — BP 130/82 | HR 85

## 2022-04-04 DIAGNOSIS — M47816 Spondylosis without myelopathy or radiculopathy, lumbar region: Secondary | ICD-10-CM

## 2022-04-04 DIAGNOSIS — M5416 Radiculopathy, lumbar region: Secondary | ICD-10-CM

## 2022-04-04 DIAGNOSIS — M5441 Lumbago with sciatica, right side: Secondary | ICD-10-CM

## 2022-04-04 DIAGNOSIS — G8929 Other chronic pain: Secondary | ICD-10-CM

## 2022-04-04 DIAGNOSIS — M48061 Spinal stenosis, lumbar region without neurogenic claudication: Secondary | ICD-10-CM | POA: Diagnosis not present

## 2022-04-04 MED ORDER — TRAMADOL HCL 50 MG PO TABS: 50.0000 mg | ORAL_TABLET | Freq: Three times a day (TID) | ORAL | 0 refills | Status: DC | PRN

## 2022-04-04 NOTE — Progress Notes (Unsigned)
Bryan Sosa - 66 y.o. male MRN [id number]  Date of birth: [birth year]  Office Visit Note: Visit Date: 04/04/2022 PCP: Maury Dus, MD Referred by: Maury Dus, MD  Subjective: Chief Complaint  Patient presents with   Lower Back - Pain   Left Leg - Pain   HPI: Bryan Sosa is a 66 y.o. male who comes in today per the request of Almedia Balls, NP for evaluation of chronic, worsening and severe right sided lower back pain radiating to buttock, hip and down right lateral leg. Daughter is accompanying him during our visit today. Pain ongoing for several weeks, worsened after exercising on rowing machine in gym. Patient states his pain has gradually increased over the last several week, states he is now using crutches to ambulate due to severe pain. He describes pain as a sore, aching and burning sensation, currently rates as 8 out of 10. Patient reports some relief of pain with home exercise regimen, rest and use of medications. Some relief of pain with recent Flexeril, Meloxicam and Prednisone taper. No history of lumbar injections/lumbar surgery. Patient does have history of Parkinson's Disease and is currently being managed by Dr. Metta Clines at [facility]. Patient denies focal weakness, numbness and tingling. Patient denies recent trauma or falls.     Oswestry Disability Index Score 52% 20 to 30 (60%) severe disability: Pain remains the main problem in this group but activities of daily living are affected. These patients require a detailed investigation.  Review of Systems  Musculoskeletal:  Positive for back pain.  Neurological:  Negative for tingling, sensory change, focal weakness and weakness.  All other systems reviewed and are negative.  Otherwise per HPI.  Assessment & Plan: Visit Diagnoses:    ICD-10-CM   1. Lumbar radiculopathy  M54.16 XR Lumbar Spine Complete    Ambulatory referral to Physical Medicine Rehab    2. Chronic right-sided low back pain with  right-sided sciatica  M54.41 Ambulatory referral to Physical Medicine Rehab   G89.29     3. Facet arthropathy, lumbar  M47.816 Ambulatory referral to Physical Medicine Rehab    4. Foraminal stenosis of lumbar region  M48.061        Plan: Findings:  Chronic, worsening and severe right sided lower back pain radiating to buttock, hip and down right lateral leg. Patient continues to have severe pain despite good conservative therapies such as home exercise regimen, rest and use of medications. Patients clinical presentation and exam are consistent with L5 nerve pattern. Lumbar x-ray imaging from visit today exhibits multilevel degenerative disc height loss and facet arthropathy/biforaminal narrowing at L5-S1. No listhesis noted. Next step is to perform diagnostic and hopefully therapeutic right L5-S1 interlaminar epidurals steroid injection under fluoroscopic guidance. Not currently taking anticoagulant medication. If patient gets good relief of pain with injection I did explain that we are able to repeat this procedure infrequently as needed. I did discuss lumbar epidural steroid injection procedure and medication management today, did prescribe short course of Tramadol to help with pain while waiting on injection. I also mentioned that at some point we would want to look at lumbar MRI imaging if we continue to perform injections. No red flag symptoms noted upon exam today.     Meds & Orders:  Meds ordered this encounter  Medications   traMADol (ULTRAM) 50 MG tablet    Sig: Take 1 tablet (50 mg total) by mouth every 8 (eight) hours as needed.    Dispense:  20 tablet  Refill:  0    Orders Placed This Encounter  Procedures   XR Lumbar Spine Complete   Ambulatory referral to Physical Medicine Rehab    Follow-up: Return for Right L5-S1 interlaminar epidural steroid injection.   Procedures: No procedures performed      Clinical History: No specialty comments available.   He reports that  he has never smoked. He has never used smokeless tobacco. No results for input(s): "HGBA1C", "LABURIC" in the last 8760 hours.  Objective:  VS:  HT:    WT:   BMI:     BP:130/82  HR:85bpm  TEMP: ( )  RESP:  Physical Exam Vitals and nursing note reviewed.  HENT:     Head: Normocephalic and atraumatic.     Right Ear: External ear normal.     Left Ear: External ear normal.     Nose: Nose normal.     Mouth/Throat:     Mouth: Mucous membranes are moist.  Eyes:     Extraocular Movements: Extraocular movements intact.  Cardiovascular:     Rate and Rhythm: Normal rate.     Pulses: Normal pulses.  Pulmonary:     Effort: Pulmonary effort is normal.  Abdominal:     General: Abdomen is flat. There is no distension.  Musculoskeletal:        General: Tenderness present.     Cervical back: Normal range of motion.     Comments: Pt rises from seated position to standing without difficulty. Good lumbar range of motion. Strong distal strength without clonus, no pain upon palpation of greater trochanters. Dysesthesias noted to right L5 dermatome. Sensation intact bilaterally. Walks independently, gait steady.   Skin:    General: Skin is warm and dry.     Capillary Refill: Capillary refill takes less than 2 seconds.  Neurological:     General: No focal deficit present.     Mental Status: He is alert and oriented to person, place, and time.     Comments: Tremor noted to left hand  Psychiatric:        Mood and Affect: Mood normal.        Behavior: Behavior normal.     Ortho Exam  Imaging: No results found.  Past Medical/Family/Surgical/Social History: Medications & Allergies reviewed per EMR, new medications updated. Patient Active Problem List   Diagnosis Date Noted   BPH (benign prostatic hyperplasia) 10/13/2021   OCD (obsessive compulsive disorder) 08/08/2018   Social anxiety disorder 08/08/2018   Insomnia 08/08/2018   Pain in the chest 05/07/2015   Obesity 05/07/2015   Family  history of early CAD 05/07/2015   Essential hypertension 05/07/2015   Past Medical History:  Diagnosis Date   Anxiety    Depression    ED (erectile dysfunction)    GERD (gastroesophageal reflux disease)    Headache    migraines   History of kidney stones    Hypertension    Insomnia    PVC (premature ventricular contraction)    Family History  Problem Relation Age of Onset   Dementia Mother    Ulcers Mother    Heart attack Father    Dementia Maternal Aunt    Dementia Paternal Uncle    Past Surgical History:  Procedure Laterality Date   bladder neck obstruction     2008   XI ROBOTIC ASSISTED SIMPLE PROSTATECTOMY N/A 10/13/2021   Procedure: XI ROBOTIC ASSISTED SIMPLE PROSTATECTOMY;  Surgeon: Alexis Frock, MD;  Location: [facility];  Service: Urology;  Laterality: N/A;  Social History   Occupational History   Not on file  Tobacco Use   Smoking status: Never   Smokeless tobacco: Never  Vaping Use   Vaping Use: Never used  Substance and Sexual Activity   Alcohol use: No   Drug use: No   Sexual activity: Not Currently

## 2022-04-04 NOTE — Progress Notes (Unsigned)
Pt state lower back pain that travels down his left leg. Pt state walking and laying down makes the pain worse. Pt state he takes pain meds to help ease his pain.  Numeric Pain Rating Scale and Functional Assessment Average Pain 9 Pain Right Now 8 My pain is intermittent, constant, sharp, burning, dull, stabbing, tingling, and aching Pain is worse with: walking, some activites, and laying down Pain improves with: medication   In the last MONTH (on 0-10 scale) has pain interfered with the following?  1. General activity like being  able to carry out your everyday physical activities such as walking, climbing stairs, carrying groceries, or moving a chair?  Rating(5)  2. Relation with others like being able to carry out your usual social activities and roles such as  activities at home, at work and in your community. Rating(6)  3. Enjoyment of life such that you have  been bothered by emotional problems such as feeling anxious, depressed or irritable?  Rating(7)

## 2022-04-08 ENCOUNTER — Other Ambulatory Visit: Payer: Self-pay | Admitting: Physical Medicine and Rehabilitation

## 2022-04-08 MED ORDER — BACLOFEN 10 MG PO TABS
10.0000 mg | ORAL_TABLET | Freq: Three times a day (TID) | ORAL | 0 refills | Status: AC
Start: 2022-04-08 — End: ?

## 2022-04-11 ENCOUNTER — Other Ambulatory Visit: Payer: Self-pay | Admitting: Neurology

## 2022-04-12 ENCOUNTER — Ambulatory Visit (INDEPENDENT_AMBULATORY_CARE_PROVIDER_SITE_OTHER): Payer: Medicare Other | Admitting: Physical Medicine and Rehabilitation

## 2022-04-12 ENCOUNTER — Encounter: Payer: Self-pay | Admitting: Physical Medicine and Rehabilitation

## 2022-04-12 ENCOUNTER — Ambulatory Visit: Payer: Self-pay

## 2022-04-12 VITALS — BP 137/87 | HR 75

## 2022-04-12 DIAGNOSIS — M5416 Radiculopathy, lumbar region: Secondary | ICD-10-CM

## 2022-04-12 MED ORDER — METHYLPREDNISOLONE ACETATE 80 MG/ML IJ SUSP
80.0000 mg | Freq: Once | INTRAMUSCULAR | Status: AC
  Administered 2022-04-12: 80 mg

## 2022-04-12 NOTE — Therapy (Unsigned)
OUTPATIENT PHYSICAL THERAPY THORACOLUMBAR EVALUATION   Patient Name: Bryan Sosa MRN: [id number] DOB:[birth year], 66 y.o., male Today's Date: 04/13/2022   PT End of Session - 04/13/22 1052     Visit Number 1    Number of Visits 8    Date for PT Re-Evaluation 06/08/22    Authorization Type U[employer]    PT Start Time 4403    PT Stop Time 1130    PT Time Calculation (min) 45 min    Activity Tolerance Patient tolerated treatment well;Patient limited by pain    Behavior During Therapy [facility] for tasks assessed/performed             Past Medical History:  Diagnosis Date   Anxiety    Depression    ED (erectile dysfunction)    GERD (gastroesophageal reflux disease)    Headache    migraines   History of kidney stones    Hypertension    Insomnia    PVC (premature ventricular contraction)    Past Surgical History:  Procedure Laterality Date   bladder neck obstruction     2008   XI ROBOTIC ASSISTED SIMPLE PROSTATECTOMY N/A 10/13/2021   Procedure: XI ROBOTIC ASSISTED SIMPLE PROSTATECTOMY;  Surgeon: Alexis Frock, MD;  Location: [facility];  Service: Urology;  Laterality: N/A;   Patient Active Problem List   Diagnosis Date Noted   BPH (benign prostatic hyperplasia) 10/13/2021   OCD (obsessive compulsive disorder) 08/08/2018   Social anxiety disorder 08/08/2018   Insomnia 08/08/2018   Pain in the chest 05/07/2015   Obesity 05/07/2015   Family history of early CAD 05/07/2015   Essential hypertension 05/07/2015    PCP: Maury Dus, MD  REFERRING PROVIDER: Almedia Balls, NP  REFERRING DIAG: M54.16 (ICD-10-CM) - Lumbar radiculopathy M54.41,G89.29 (ICD-10-CM) - Chronic right-sided low back pain with right-sided sciatica M47.816 (ICD-10-CM) - Facet arthropathy, lumbar   Rationale for Evaluation and Treatment Rehabilitation  THERAPY DIAG: right sided low back pain with right sided sciatica    ONSET DATE: chronic  SUBJECTIVE:                                                                                                                                                                                            SUBJECTIVE STATEMENT: Relates R sided hip pain with symptoms extending into R LE, describes a burning sensation in R buttock and has difficulty sitting for prolonged periods PERTINENT HISTORY:  HPI: Bryan Sosa is a 66 y.o. male who comes in today per the request of Almedia Balls, NP for evaluation of chronic, worsening and severe right sided lower back pain radiating to buttock, hip  and down right lateral leg. Daughter is accompanying him during our visit today. Pain ongoing for several weeks, worsened after exercising on rowing machine in gym. Patient states his pain has gradually increased over the last several week, states he is now using crutches to ambulate due to severe pain. He describes pain as a sore, aching and burning sensation, currently rates as 8 out of 10. Patient reports some relief of pain with home exercise regimen, rest and use of medications. Some relief of pain with recent Flexeril, Meloxicam and Prednisone taper. No history of lumbar injections/lumbar surgery. Patient does have history of Parkinson's Disease and is currently being managed by Dr. Metta Clines at [facility]. Patient denies focal weakness, numbness and tingling. Patient denies recent trauma or falls.        Oswestry Disability Index Score 52% 20 to 30 (60%) severe disability: Pain remains the main problem in this group but activities of daily living are affected. These patients require a detailed investigation.    PAIN:  Are you having pain? Yes: NPRS scale: 10/10 Pain location: R buttock Pain description: ache, cramp Aggravating factors: prolonged sitting and pressure on R buttock Relieving factors: position changes and meds   PRECAUTIONS: Back  WEIGHT BEARING RESTRICTIONS No  FALLS:  Has patient fallen in last 6 months? No  LIVING ENVIRONMENT: Lives with: lives  with their family Lives in: House/apartment Stairs: No Has following equipment at home: Crutches and shower chair  OCCUPATION: not working  PLOF: Independent  PATIENT GOALS To decrease and manage my pain   OBJECTIVE:   DIAGNOSTIC FINDINGS:  Radiographs exhibit leftward curvature with no listhesis, multilevel  degenerative disc height loss, facet arthropathy and biforaminal narrowing  at L5-S1. No fractures or dislocations noted.  PATIENT SURVEYS:  FOTO 37(55 predicted)  SCREENING FOR RED FLAGS: Bowel or bladder incontinence: No   COGNITION:  Overall cognitive status: Within functional limits for tasks assessed     SENSATION: Not tested  MUSCLE LENGTH: Hamstrings: Right 50 deg; Left 70 deg Thomas test: Right positive for paresthesias in ankle R  POSTURE:  not tested   PALPATION: TTP R piriformis   LUMBAR ROM: Deferred due to pain  Active  A/PROM  eval  Flexion   Extension   Right lateral flexion   Left lateral flexion   Right rotation   Left rotation    (Blank rows = not tested)  LOWER EXTREMITY ROM:   PROM WFL in B hips  Passive  Right eval Left eval  Hip flexion    Hip extension    Hip abduction    Hip adduction    Hip internal rotation    Hip external rotation    Knee flexion    Knee extension    Ankle dorsiflexion    Ankle plantarflexion    Ankle inversion    Ankle eversion     (Blank rows = not tested)  LOWER EXTREMITY MMT:    MMT Right eval Left eval  Hip flexion 4 4  Hip extension 4 4  Hip abduction    Hip adduction    Hip internal rotation    Hip external rotation    Knee flexion    Knee extension 4 4  Ankle dorsiflexion    Ankle plantarflexion 4 4  Ankle inversion    Ankle eversion     (Blank rows = not tested)  LUMBAR SPECIAL TESTS:  Straight leg raise test: Positive, Slump test: Negative, FABER test: Negative, and Thomas test: Positive  FUNCTIONAL TESTS:  30s stand test 3 reps  GAIT: Distance walked:  56fx2 Assistive device utilized: Crutches Level of assistance: Modified independence Comments: antalgic gait    TODAY'S TREATMENT  Eval and HEP   PATIENT EDUCATION:  Education details: Discussed eval findings, rehab rationale and POC and patient is in agreement  Person educated: Patient Education method: ETheatre stage managerEducation comprehension: verbalized understanding and needs further education   HOME EXERCISE PROGRAM: Access Code: [email or web address] URL: [email or web address] Date: 04/13/2022 Prepared by: JSharlynn Oliphant Exercises - Clamshell  - 2 x daily - 7 x weekly - 2 sets - 15 reps - Supine Figure 4 Piriformis Stretch  - 2 x daily - 7 x weekly - 1 sets - 3 reps - 30s hold  ASSESSMENT:  CLINICAL IMPRESSION: Patient is a 66y.o. male who was seen today for physical therapy evaluation and treatment for R piriformis strain, sciatic irritation and  overlying PD.  He recently underwent and epidural injection within 24 hrs.  Patient demonstrates R piriformis irritation with palpation, however stretching does not aggravate symptoms(epidural effects?).  No distinct sciatic tension signs elicited.  Lumbar mobility testing limited due to pain and patient currently using crutches for pain relief.   OBJECTIVE IMPAIRMENTS Abnormal gait, decreased activity tolerance, decreased knowledge of condition, decreased mobility, difficulty walking, decreased ROM, decreased strength, increased muscle spasms, impaired flexibility, and pain.   ACTIVITY LIMITATIONS bending, sitting, standing, sleeping, stairs, and locomotion level  PERSONAL FACTORS 1 comorbidity: PD  are also affecting patient's functional outcome.   REHAB POTENTIAL: Good  CLINICAL DECISION MAKING: Evolving/moderate complexity  EVALUATION COMPLEXITY: Moderate   GOALS: Goals reviewed with patient? No  SHORT TERM GOALS: Target date: 04/27/2022  Patient to demonstrate independence in  HEP Baseline:[email or web address] Goal status: INITIAL  2.  Assess lumbar mobility and set appropriate goal Baseline: TBD Goal status: INITIAL  3.  Patient to ambulate w/o need of crutches due to pain Baseline: Need of B crutches due to pain Goal status: INITIAL    LONG TERM GOALS: Target date: 05/11/2022  4/10 worst pain Baseline: 10/10 worst pain Goal status: INITIAL  2.  Increase FOTO score to 55 Baseline: 37 Goal status: INITIAL  3.  Increase 30s stand test to 5 or greater arms crossed from high table Baseline: 3 stands Goal status: INITIAL  4.  Increase BLE strength to 4+/5 in B hips/knees/ankles  Baseline: 4/5 B hip/knee/ankle strength Goal status: INITIAL    PLAN: PT FREQUENCY: 2x/week  PT DURATION: 4 weeks  PLANNED INTERVENTIONS: Therapeutic exercises, Therapeutic activity, Neuromuscular re-education, Balance training, Gait training, Patient/Family education, Self Care, Joint mobilization, Stair training, DME instructions, Dry Needling, Manual therapy, and Re-evaluation.  PLAN FOR NEXT SESSION: HEP update, assess benefit of injection, gait training, stretch and strength of R hip, manual to R piriformis   JLanice Shirts PT 04/13/2022, 10:55 AM

## 2022-04-12 NOTE — Patient Instructions (Signed)

## 2022-04-12 NOTE — Progress Notes (Signed)
Pt state lower back pain that travels down his right leg. Pt state walking and laying down makes the pain worse. Pt state he takes pain meds to help ease his pain.  Numeric Pain Rating Scale and Functional Assessment Average Pain 8   In the last MONTH (on 0-10 scale) has pain interfered with the following?  1. General activity like being  able to carry out your everyday physical activities such as walking, climbing stairs, carrying groceries, or moving a chair?  Rating(10)   +Driver, -BT, -Dye Allergies.

## 2022-04-13 ENCOUNTER — Ambulatory Visit: Payer: Medicare Other | Attending: Family Medicine

## 2022-04-13 DIAGNOSIS — G20 Parkinson's disease: Secondary | ICD-10-CM | POA: Diagnosis present

## 2022-04-13 DIAGNOSIS — M6281 Muscle weakness (generalized): Secondary | ICD-10-CM | POA: Diagnosis present

## 2022-04-13 DIAGNOSIS — M5459 Other low back pain: Secondary | ICD-10-CM | POA: Insufficient documentation

## 2022-04-13 DIAGNOSIS — M5431 Sciatica, right side: Secondary | ICD-10-CM | POA: Insufficient documentation

## 2022-04-18 ENCOUNTER — Other Ambulatory Visit: Payer: Self-pay | Admitting: Physical Medicine and Rehabilitation

## 2022-04-20 ENCOUNTER — Ambulatory Visit: Payer: Medicare Other

## 2022-04-20 DIAGNOSIS — M5431 Sciatica, right side: Secondary | ICD-10-CM | POA: Diagnosis not present

## 2022-04-20 DIAGNOSIS — M6281 Muscle weakness (generalized): Secondary | ICD-10-CM

## 2022-04-20 DIAGNOSIS — M5459 Other low back pain: Secondary | ICD-10-CM

## 2022-04-20 NOTE — Therapy (Signed)
OUTPATIENT PHYSICAL THERAPY TREATMENT NOTE   Patient Name: [name] MRN: [id number] DOB:09-Mar-1956, 66 y.o., male Today's Date: 04/20/2022  PCP: Maury Dus, MD REFERRING PROVIDER: Almedia Balls, NP  END OF SESSION:   PT End of Session - 04/20/22 1252     Visit Number 2    Number of Visits 8    Date for PT Re-Evaluation 06/08/22    Authorization Type UHC    PT Start Time 1300    PT Stop Time 1340    PT Time Calculation (min) 40 min    Activity Tolerance Patient tolerated treatment well;Patient limited by pain    Behavior During Therapy [facility] for tasks assessed/performed             Past Medical History:  Diagnosis Date   Anxiety    Depression    ED (erectile dysfunction)    GERD (gastroesophageal reflux disease)    Headache    migraines   History of kidney stones    Hypertension    Insomnia    PVC (premature ventricular contraction)    Past Surgical History:  Procedure Laterality Date   bladder neck obstruction     2008   XI ROBOTIC ASSISTED SIMPLE PROSTATECTOMY N/A 10/13/2021   Procedure: XI ROBOTIC ASSISTED SIMPLE PROSTATECTOMY;  Surgeon: Alexis Frock, MD;  Location: [facility];  Service: Urology;  Laterality: N/A;   Patient Active Problem List   Diagnosis Date Noted   BPH (benign prostatic hyperplasia) 10/13/2021   OCD (obsessive compulsive disorder) 08/08/2018   Social anxiety disorder 08/08/2018   Insomnia 08/08/2018   Pain in the chest 05/07/2015   Obesity 05/07/2015   Family history of early CAD 05/07/2015   Essential hypertension 05/07/2015    REFERRING DIAG: M54.16 (ICD-10-CM) - Lumbar radiculopathy M54.41,G89.29 (ICD-10-CM) - Chronic right-sided low back pain with right-sided sciatica M47.816 (ICD-10-CM) - Facet arthropathy, lumbar   THERAPY DIAG:  Sciatica, right side  Other low back pain  Muscle weakness (generalized)  Rationale for Evaluation and Treatment Rehabilitation  PERTINENT HISTORY: HPI: Bryan Sosa is a 66 y.o. male  who comes in today per the request of Almedia Balls, NP for evaluation of chronic, worsening and severe right sided lower back pain radiating to buttock, hip and down right lateral leg. Daughter is accompanying him during our visit today. Pain ongoing for several weeks, worsened after exercising on rowing machine in gym. Patient states his pain has gradually increased over the last several week, states he is now using crutches to ambulate due to severe pain. He describes pain as a sore, aching and burning sensation, currently rates as 8 out of 10. Patient reports some relief of pain with home exercise regimen, rest and use of medications. Some relief of pain with recent Flexeril, Meloxicam and Prednisone taper. No history of lumbar injections/lumbar surgery. Patient does have history of Parkinson's Disease and is currently being managed by Dr. Metta Clines at [facility]. Patient denies focal weakness, numbness and tingling. Patient denies recent trauma or falls.   PRECAUTIONS: Back  SUBJECTIVE: Patient reports pain in R hip and on lateral side of R ankle.  PAIN:  Are you having pain? Yes: NPRS scale: 3/10 Pain location: R buttock Pain description: ache, cramp Aggravating factors: prolonged sitting and pressure on R buttock Relieving factors: position changes and meds   OBJECTIVE: (objective measures completed at initial evaluation unless otherwise dated)   DIAGNOSTIC FINDINGS:  Radiographs exhibit leftward curvature with no listhesis, multilevel  degenerative disc height loss,  facet arthropathy and biforaminal narrowing  at L5-S1. No fractures or dislocations noted.   PATIENT SURVEYS:  FOTO 37(55 predicted)   SCREENING FOR RED FLAGS: Bowel or bladder incontinence: No     COGNITION:           Overall cognitive status: Within functional limits for tasks assessed                          SENSATION: Not tested   MUSCLE LENGTH: Hamstrings: Right 50 deg; Left 70 deg Thomas test:  Right positive for paresthesias in ankle R   POSTURE:  not tested    PALPATION: TTP R piriformis    LUMBAR ROM: Deferred due to pain   Active  A/PROM  eval  Flexion    Extension    Right lateral flexion    Left lateral flexion    Right rotation    Left rotation     (Blank rows = not tested)   LOWER EXTREMITY ROM:   PROM WFL in B hips   Passive  Right eval Left eval  Hip flexion      Hip extension      Hip abduction      Hip adduction      Hip internal rotation      Hip external rotation      Knee flexion      Knee extension      Ankle dorsiflexion      Ankle plantarflexion      Ankle inversion      Ankle eversion       (Blank rows = not tested)   LOWER EXTREMITY MMT:     MMT Right eval Left eval  Hip flexion 4 4  Hip extension 4 4  Hip abduction      Hip adduction      Hip internal rotation      Hip external rotation      Knee flexion      Knee extension 4 4  Ankle dorsiflexion      Ankle plantarflexion 4 4  Ankle inversion      Ankle eversion       (Blank rows = not tested)   LUMBAR SPECIAL TESTS:  Straight leg raise test: Positive, Slump test: Negative, FABER test: Negative, and Thomas test: Positive   FUNCTIONAL TESTS:  30s stand test 3 reps   GAIT: Distance walked: 8fx2 Assistive device utilized: Crutches Level of assistance: Modified independence Comments: antalgic gait       TODAY'S TREATMENT  [facility] Adult PT Treatment:                                                DATE: 04/20/2022 Therapeutic Exercise: Nustep level 5 x 5 mins Seated marching 2x10 BIL STS with OH reach x10 Supine clamshell 2x10 Supine marching against GTB 2x10 BIL Supine figure 4 stretch 2x30" BIL Supine hamstring stretch with strap 2x30" BIL Sidelying hip abduction Rt small range 2x10 SLR 2x10 BIL Modified thomas stretch EOM Rt (too painful) Seated sciatic nerve glides x15  04/13/2022: Eval and HEP     PATIENT EDUCATION:  Education details: Discussed eval  findings, rehab rationale and POC and patient is in agreement  Person educated: Patient Education method: Explanation and Handouts Education comprehension: verbalized understanding and needs further education  HOME EXERCISE PROGRAM: Access Code: [email or web address] URL: [email or web address] Date: 04/13/2022 Prepared by: Sharlynn Oliphant   Exercises - Clamshell  - 2 x daily - 7 x weekly - 2 sets - 15 reps - Supine Figure 4 Piriformis Stretch  - 2 x daily - 7 x weekly - 1 sets - 3 reps - 30s hold Added 04/20/22 - Sit to Stand Without Arm Support  - 2 x daily - 7 x weekly - 2 sets - 10 reps - Supine Active Straight Leg Raise  - 2 x daily - 7 x weekly - 2 sets - 10 reps - Seated Sciatic Tensioner  - 2 x daily - 7 x weekly - 2 sets - 10 reps   ASSESSMENT:   CLINICAL IMPRESSION: Patient presents to PT with continued pain in his R hip/buttock area and radiating down the R leg to lateral side of R ankle and ambulated with bilateral crutches. He reports occasional HEP compliance. Updated HEP today with patient demonstrating understanding. Session today focused on proximal hip and RLE strengthening. He was limited by pain throughout session, needing to change positions to relieve pain occasionally. Patient continues to benefit from skilled PT services and should be progressed as able to improve functional independence.      OBJECTIVE IMPAIRMENTS Abnormal gait, decreased activity tolerance, decreased knowledge of condition, decreased mobility, difficulty walking, decreased ROM, decreased strength, increased muscle spasms, impaired flexibility, and pain.    ACTIVITY LIMITATIONS bending, sitting, standing, sleeping, stairs, and locomotion level   PERSONAL FACTORS 1 comorbidity: PD  are also affecting patient's functional outcome.    REHAB POTENTIAL: Good   CLINICAL DECISION MAKING: Evolving/moderate complexity   EVALUATION COMPLEXITY: Moderate     GOALS: Goals reviewed with patient?  No   SHORT TERM GOALS: Target date: 04/27/2022   Patient to demonstrate independence in HEP Baseline:[email or web address] Goal status: INITIAL   2.  Assess lumbar mobility and set appropriate goal Baseline: TBD Goal status: INITIAL   3.  Patient to ambulate w/o need of crutches due to pain Baseline: Need of B crutches due to pain Goal status: INITIAL       LONG TERM GOALS: Target date: 05/11/2022   4/10 worst pain Baseline: 10/10 worst pain Goal status: INITIAL   2.  Increase FOTO score to 55 Baseline: 37 Goal status: INITIAL   3.  Increase 30s stand test to 5 or greater arms crossed from high table Baseline: 3 stands Goal status: INITIAL   4.  Increase BLE strength to 4+/5 in B hips/knees/ankles  Baseline: 4/5 B hip/knee/ankle strength Goal status: INITIAL       PLAN: PT FREQUENCY: 2x/week   PT DURATION: 4 weeks   PLANNED INTERVENTIONS: Therapeutic exercises, Therapeutic activity, Neuromuscular re-education, Balance training, Gait training, Patient/Family education, Self Care, Joint mobilization, Stair training, DME instructions, Dry Needling, Manual therapy, and Re-evaluation.   PLAN FOR NEXT SESSION: HEP update, assess benefit of injection, gait training, stretch and strength of R hip, manual to R piriformis    Margarette Canada, PTA 04/20/2022, 1:42 PM

## 2022-04-22 NOTE — Therapy (Signed)
OUTPATIENT PHYSICAL THERAPY TREATMENT NOTE   Patient Name: Bryan Sosa MRN: [id number] DOB:May 21, 1956, 66 y.o., male Today's Date: 04/23/2022  PCP: Bryan Dus, MD REFERRING PROVIDER: Almedia Balls, NP  END OF SESSION:   PT End of Session - 04/23/22 1115     Visit Number 3    Number of Visits 8    Date for PT Re-Evaluation 06/08/22    Authorization Type U[employer]    PT Start Time 1115    PT Stop Time 7989    PT Time Calculation (min) 43 min    Activity Tolerance Patient tolerated treatment well;Patient limited by pain    Behavior During Therapy [facility] for tasks assessed/performed              Past Medical History:  Diagnosis Date   Anxiety    Depression    ED (erectile dysfunction)    GERD (gastroesophageal reflux disease)    Headache    migraines   History of kidney stones    Hypertension    Insomnia    PVC (premature ventricular contraction)    Past Surgical History:  Procedure Laterality Date   bladder neck obstruction     2008   XI ROBOTIC ASSISTED SIMPLE PROSTATECTOMY N/A 10/13/2021   Procedure: XI ROBOTIC ASSISTED SIMPLE PROSTATECTOMY;  Surgeon: Bryan Frock, MD;  Location: [facility];  Service: Urology;  Laterality: N/A;   Patient Active Problem List   Diagnosis Date Noted   BPH (benign prostatic hyperplasia) 10/13/2021   OCD (obsessive compulsive disorder) 08/08/2018   Social anxiety disorder 08/08/2018   Insomnia 08/08/2018   Pain in the chest 05/07/2015   Obesity 05/07/2015   Family history of early CAD 05/07/2015   Essential hypertension 05/07/2015    REFERRING DIAG: M54.16 (ICD-10-CM) - Lumbar radiculopathy M54.41,G89.29 (ICD-10-CM) - Chronic right-sided low back pain with right-sided sciatica M47.816 (ICD-10-CM) - Facet arthropathy, lumbar   THERAPY DIAG:  Sciatica, right side  Other low back pain  Muscle weakness (generalized)  Parkinson's disease ([place])  Rationale for Evaluation and Treatment Rehabilitation  PERTINENT HISTORY: HPI:  Bryan Sosa is a 65 y.o. male who comes in today per the request of Bryan Balls, NP for evaluation of chronic, worsening and severe right sided lower back pain radiating to buttock, hip and down right lateral leg. Daughter is accompanying him during our visit today. Pain ongoing for several weeks, worsened after exercising on rowing machine in gym. Patient states his pain has gradually increased over the last several week, states he is now using crutches to ambulate due to severe pain. He describes pain as a sore, aching and burning sensation, currently rates as 8 out of 10. Patient reports some relief of pain with home exercise regimen, rest and use of medications. Some relief of pain with recent Flexeril, Meloxicam and Prednisone taper. No history of lumbar injections/lumbar surgery. Patient does have history of Parkinson's Disease and is currently being managed by Bryan Sosa at [facility]. Patient denies focal weakness, numbness and tingling. Patient denies recent trauma or falls.   PRECAUTIONS: Back  SUBJECTIVE: Patient reports pain in R hip and on lateral side of R ankle.  PAIN:  Are you having pain? Yes: NPRS scale: 4/10 Pain location: R buttock Pain description: ache, cramp Aggravating factors: prolonged sitting and pressure on R buttock Relieving factors: position changes and meds   OBJECTIVE: (objective measures completed at initial evaluation unless otherwise dated)   DIAGNOSTIC FINDINGS:  Radiographs exhibit leftward curvature with no listhesis, multilevel  degenerative disc height loss, facet arthropathy and biforaminal narrowing  at L5-S1. No fractures or dislocations noted.   PATIENT SURVEYS:  FOTO 37(55 predicted)   SCREENING FOR RED FLAGS: Bowel or bladder incontinence: No     COGNITION:           Overall cognitive status: Within functional limits for tasks assessed                          SENSATION: Not tested   MUSCLE LENGTH: Hamstrings: Right 50  deg; Left 70 deg Thomas test: Right positive for paresthesias in ankle R   POSTURE:  not tested    PALPATION: TTP R piriformis    LUMBAR ROM: Deferred due to pain   Active  A/PROM  eval  Flexion    Extension    Right lateral flexion    Left lateral flexion    Right rotation    Left rotation     (Blank rows = not tested)   LOWER EXTREMITY ROM:   PROM WFL in B hips   Passive  Right eval Left eval  Hip flexion      Hip extension      Hip abduction      Hip adduction      Hip internal rotation      Hip external rotation      Knee flexion      Knee extension      Ankle dorsiflexion      Ankle plantarflexion      Ankle inversion      Ankle eversion       (Blank rows = not tested)   LOWER EXTREMITY MMT:     MMT Right eval Left eval  Hip flexion 4 4  Hip extension 4 4  Hip abduction      Hip adduction      Hip internal rotation      Hip external rotation      Knee flexion      Knee extension 4 4  Ankle dorsiflexion      Ankle plantarflexion 4 4  Ankle inversion      Ankle eversion       (Blank rows = not tested)   LUMBAR SPECIAL TESTS:  Straight leg raise test: Positive, Slump test: Negative, FABER test: Negative, and Thomas test: Positive   FUNCTIONAL TESTS:  30s stand test 3 reps   GAIT: Distance walked: 80fx2 Assistive device utilized: Crutches Level of assistance: Modified independence Comments: antalgic gait       TODAY'S TREATMENT  [facility] Adult PT Treatment:                                                DATE: 04/23/2022 Therapeutic Exercise: Nustep level 5 x 5 mins Omega knee extension 10# 2x10 BIL, 5# Rt only 2x10 Omega knee flexion 25# 2x10  Standing marching against wall x10 BIL (increase in pain) STS with OH reach x10 Supine clamshell GTB 2x10 Supine marching against GTB 2x10 BIL Supine figure 4 stretch 2x30" Rt Sidelying hip abduction Rt small range 2x10 SLR 2x10 BIL  [facility] Adult PT Treatment:  DATE: 04/20/2022 Therapeutic Exercise: Nustep level 5 x 5 mins Seated marching 2x10 BIL STS with OH reach x10 Supine clamshell 2x10 Supine marching against GTB 2x10 BIL Supine figure 4 stretch 2x30" BIL Supine hamstring stretch with strap 2x30" BIL Sidelying hip abduction Rt small range 2x10 SLR 2x10 BIL Modified thomas stretch EOM Rt (too painful) Seated sciatic nerve glides x15  04/13/2022: Eval and HEP     PATIENT EDUCATION:  Education details: Discussed eval findings, rehab rationale and POC and patient is in agreement  Person educated: Patient Education method: Explanation and Handouts Education comprehension: verbalized understanding and needs further education     HOME EXERCISE PROGRAM: Access Code: [email or web address] URL: [email or web address] Date: 04/13/2022 Prepared by: Sharlynn Oliphant   Exercises - Clamshell  - 2 x daily - 7 x weekly - 2 sets - 15 reps - Supine Figure 4 Piriformis Stretch  - 2 x daily - 7 x weekly - 1 sets - 3 reps - 30s hold Added 04/20/22 - Sit to Stand Without Arm Support  - 2 x daily - 7 x weekly - 2 sets - 10 reps - Supine Active Straight Leg Raise  - 2 x daily - 7 x weekly - 2 sets - 10 reps - Seated Sciatic Tensioner  - 2 x daily - 7 x weekly - 2 sets - 10 reps   ASSESSMENT:   CLINICAL IMPRESSION: Patient presents to PT with continued pain in his R hip and R ankle, describes it as not radiating, but different isolated pain. He reports the most difficult exercise at home is STS with OH reach, stating this exacerbates his pain. Session today focused on proximal hip and RLE strengthening. He was limited by pain throughout session, needing to change positions to relieve pain occasionally. The most difficult and painful exercise today was standing marching against wall, SLS on RLE when marching the LLE up exacerbating the pain in the Rt hip and lower leg. Patient continues to benefit from skilled PT services and should be progressed as  able to improve functional independence.    OBJECTIVE IMPAIRMENTS Abnormal gait, decreased activity tolerance, decreased knowledge of condition, decreased mobility, difficulty walking, decreased ROM, decreased strength, increased muscle spasms, impaired flexibility, and pain.    ACTIVITY LIMITATIONS bending, sitting, standing, sleeping, stairs, and locomotion level   PERSONAL FACTORS 1 comorbidity: PD  are also affecting patient's functional outcome.    REHAB POTENTIAL: Good   CLINICAL DECISION MAKING: Evolving/moderate complexity   EVALUATION COMPLEXITY: Moderate     GOALS: Goals reviewed with patient? No   SHORT TERM GOALS: Target date: 04/27/2022   Patient to demonstrate independence in HEP Baseline:[email or web address] Goal status: MET Pt reports adherence 04/23/22   2.  Assess lumbar mobility and set appropriate goal Baseline: TBD Goal status: INITIAL   3.  Patient to ambulate w/o need of crutches due to pain Baseline: Need of B crutches due to pain Goal status: INITIAL       LONG TERM GOALS: Target date: 05/11/2022   4/10 worst pain Baseline: 10/10 worst pain Goal status: INITIAL   2.  Increase FOTO score to 55 Baseline: 37 Goal status: INITIAL   3.  Increase 30s stand test to 5 or greater arms crossed from high table Baseline: 3 stands Goal status: INITIAL   4.  Increase BLE strength to 4+/5 in B hips/knees/ankles  Baseline: 4/5 B hip/knee/ankle strength Goal status: INITIAL       PLAN: PT FREQUENCY: 2x/week   PT DURATION: 4 weeks  PLANNED INTERVENTIONS: Therapeutic exercises, Therapeutic activity, Neuromuscular re-education, Balance training, Gait training, Patient/Family education, Self Care, Joint mobilization, Stair training, DME instructions, Dry Needling, Manual therapy, and Re-evaluation.   PLAN FOR NEXT SESSION: HEP update, assess benefit of injection, gait training, stretch and strength of R hip, manual to R piriformis    Margarette Canada,  PTA 04/23/2022, 12:05 PM

## 2022-04-23 ENCOUNTER — Ambulatory Visit: Payer: Medicare Other

## 2022-04-23 DIAGNOSIS — M5431 Sciatica, right side: Secondary | ICD-10-CM | POA: Diagnosis not present

## 2022-04-23 DIAGNOSIS — M5459 Other low back pain: Secondary | ICD-10-CM

## 2022-04-23 DIAGNOSIS — M6281 Muscle weakness (generalized): Secondary | ICD-10-CM

## 2022-04-23 DIAGNOSIS — G20 Parkinson's disease: Secondary | ICD-10-CM

## 2022-04-25 ENCOUNTER — Telehealth: Payer: Self-pay | Admitting: Physical Medicine and Rehabilitation

## 2022-04-25 NOTE — Telephone Encounter (Signed)
Called pt about appt on 9/7 and pt states that he would like to get a refill on his pain meds until he is able to come in.  Cb [phone]

## 2022-04-25 NOTE — Telephone Encounter (Signed)
Pt called and states he wants to come back in to talk about hip and leg pain. He states he had an injection on 8/15 that was helping about 50% but then the pain just fully came back. Wondering what to do from here?   Cb [phone]

## 2022-04-26 ENCOUNTER — Ambulatory Visit: Payer: Medicare Other

## 2022-04-26 DIAGNOSIS — M6281 Muscle weakness (generalized): Secondary | ICD-10-CM

## 2022-04-26 DIAGNOSIS — M5459 Other low back pain: Secondary | ICD-10-CM

## 2022-04-26 DIAGNOSIS — M5431 Sciatica, right side: Secondary | ICD-10-CM | POA: Diagnosis not present

## 2022-04-26 DIAGNOSIS — G20 Parkinson's disease: Secondary | ICD-10-CM

## 2022-04-26 MED ORDER — TRAMADOL HCL 50 MG PO TABS: 50.0000 mg | ORAL_TABLET | Freq: Three times a day (TID) | ORAL | 0 refills | Status: DC | PRN

## 2022-04-26 NOTE — Therapy (Signed)
OUTPATIENT PHYSICAL THERAPY TREATMENT NOTE   Patient Name: Bryan Sosa MRN: [id number] DOB:07-30-1956, 66 y.o., male Today's Date: 04/26/2022  PCP: Maury Dus, MD REFERRING PROVIDER: Almedia Balls, NP  END OF SESSION:   PT End of Session - 04/26/22 1430     Visit Number 4    Number of Visits 8    Date for PT Re-Evaluation 06/08/22    Authorization Type UHC    PT Start Time 6294    PT Stop Time 7654    PT Time Calculation (min) 42 min    Activity Tolerance Patient tolerated treatment well;Patient limited by pain    Behavior During Therapy [facility] for tasks assessed/performed             Past Medical History:  Diagnosis Date   Anxiety    Depression    ED (erectile dysfunction)    GERD (gastroesophageal reflux disease)    Headache    migraines   History of kidney stones    Hypertension    Insomnia    PVC (premature ventricular contraction)    Past Surgical History:  Procedure Laterality Date   bladder neck obstruction     2008   XI ROBOTIC ASSISTED SIMPLE PROSTATECTOMY N/A 10/13/2021   Procedure: XI ROBOTIC ASSISTED SIMPLE PROSTATECTOMY;  Surgeon: Alexis Frock, MD;  Location: [facility];  Service: Urology;  Laterality: N/A;   Patient Active Problem List   Diagnosis Date Noted   BPH (benign prostatic hyperplasia) 10/13/2021   Mixed conductive and sensorineural hearing loss of left ear with restricted hearing of right ear 12/17/2020   OCD (obsessive compulsive disorder) 08/08/2018   Social anxiety disorder 08/08/2018   Insomnia 08/08/2018   Pain in the chest 05/07/2015   Obesity 05/07/2015   Family history of early CAD 05/07/2015   Essential hypertension 05/07/2015    REFERRING DIAG: M54.16 (ICD-10-CM) - Lumbar radiculopathy M54.41,G89.29 (ICD-10-CM) - Chronic right-sided low back pain with right-sided sciatica M47.816 (ICD-10-CM) - Facet arthropathy, lumbar   THERAPY DIAG:  Sciatica, right side  Other low back pain  Muscle weakness  (generalized)  Parkinson's disease ([place])  Rationale for Evaluation and Treatment Rehabilitation  PERTINENT HISTORY: HPI: Bryan Sosa is a 66 y.o. male who comes in today per the request of Almedia Balls, NP for evaluation of chronic, worsening and severe right sided lower back pain radiating to buttock, hip and down right lateral leg. Daughter is accompanying him during our visit today. Pain ongoing for several weeks, worsened after exercising on rowing machine in gym. Patient states his pain has gradually increased over the last several week, states he is now using crutches to ambulate due to severe pain. He describes pain as a sore, aching and burning sensation, currently rates as 8 out of 10. Patient reports some relief of pain with home exercise regimen, rest and use of medications. Some relief of pain with recent Flexeril, Meloxicam and Prednisone taper. No history of lumbar injections/lumbar surgery. Patient does have history of Parkinson's Disease and is currently being managed by Dr. Metta Clines at [facility]. Patient denies focal weakness, numbness and tingling. Patient denies recent trauma or falls.   PRECAUTIONS: Back  SUBJECTIVE: Patient reports pain in R hip and on lateral side of R ankle.  PAIN:  Are you having pain? Yes: NPRS scale: 4/10 Pain location: R buttock Pain description: ache, cramp Aggravating factors: prolonged sitting and pressure on R buttock Relieving factors: position changes and meds   OBJECTIVE: (objective measures completed at initial  evaluation unless otherwise dated)   DIAGNOSTIC FINDINGS:  Radiographs exhibit leftward curvature with no listhesis, multilevel  degenerative disc height loss, facet arthropathy and biforaminal narrowing  at L5-S1. No fractures or dislocations noted.   PATIENT SURVEYS:  FOTO 37(55 predicted)   SCREENING FOR RED FLAGS: Bowel or bladder incontinence: No     COGNITION:           Overall cognitive status: Within  functional limits for tasks assessed                          SENSATION: Not tested   MUSCLE LENGTH: Hamstrings: Right 50 deg; Left 70 deg Thomas test: Right positive for paresthesias in ankle R   POSTURE:  not tested    PALPATION: TTP R piriformis    LUMBAR ROM: Deferred due to pain   Active  A/PROM  eval  Flexion    Extension    Right lateral flexion    Left lateral flexion    Right rotation    Left rotation     (Blank rows = not tested)   LOWER EXTREMITY ROM:   PROM WFL in B hips   Passive  Right eval Left eval  Hip flexion      Hip extension      Hip abduction      Hip adduction      Hip internal rotation      Hip external rotation      Knee flexion      Knee extension      Ankle dorsiflexion      Ankle plantarflexion      Ankle inversion      Ankle eversion       (Blank rows = not tested)   LOWER EXTREMITY MMT:     MMT Right eval Left eval  Hip flexion 4 4  Hip extension 4 4  Hip abduction      Hip adduction      Hip internal rotation      Hip external rotation      Knee flexion      Knee extension 4 4  Ankle dorsiflexion      Ankle plantarflexion 4 4  Ankle inversion      Ankle eversion       (Blank rows = not tested)   LUMBAR SPECIAL TESTS:  Straight leg raise test: Positive, Slump test: Negative, FABER test: Negative, and Thomas test: Positive   FUNCTIONAL TESTS:  30s stand test 3 reps   GAIT: Distance walked: 16ftx2 Assistive device utilized: Crutches Level of assistance: Modified independence Comments: antalgic gait       TODAY'S TREATMENT  [facility] Adult PT Treatment:                                                DATE: 04/26/2022 Therapeutic Exercise: Nustep level 5 x 5 mins Omega knee extension 10# 2x10 BIL, 5# Rt only 2x10 Omega knee flexion 25# 2x10  Seated hamstring stretch 2x30" Rt STS with OH reach 2x5 Supine clamshell GTB 2x10 Supine marching against GTB 2x10 BIL Supine figure 4 stretch 2x30" BIL Sidelying hip  abduction Rt small range 2x10 SLR 2x10 BIL Lt sidelying ITB stretch Rt LE off EOM with Rt UE OH reach x30" (increase in pain from knee to ankle) Seated sciatic  nerve glide x10 Rt  [facility] Adult PT Treatment:                                                DATE: 04/23/2022 Therapeutic Exercise: Nustep level 5 x 5 mins Omega knee extension 10# 2x10 BIL, 5# Rt only 2x10 Omega knee flexion 25# 2x10  Standing marching against wall x10 BIL (increase in pain) STS with OH reach x10 Supine clamshell GTB 2x10 Supine marching against GTB 2x10 BIL Supine figure 4 stretch 2x30" Rt Sidelying hip abduction Rt small range 2x10 SLR 2x10 BIL  [facility] Adult PT Treatment:                                                DATE: 04/20/2022 Therapeutic Exercise: Nustep level 5 x 5 mins Seated marching 2x10 BIL STS with OH reach x10 Supine clamshell 2x10 Supine marching against GTB 2x10 BIL Supine figure 4 stretch 2x30" BIL Supine hamstring stretch with strap 2x30" BIL Sidelying hip abduction Rt small range 2x10 SLR 2x10 BIL Modified thomas stretch EOM Rt (too painful) Seated sciatic nerve glides x15    PATIENT EDUCATION:  Education details: Discussed eval findings, rehab rationale and POC and patient is in agreement  Person educated: Patient Education method: Explanation and Handouts Education comprehension: verbalized understanding and needs further education     HOME EXERCISE PROGRAM: Access Code: [email or web address] URL: [email or web address] Date: 04/13/2022 Prepared by: Sharlynn Oliphant   Exercises - Clamshell  - 2 x daily - 7 x weekly - 2 sets - 15 reps - Supine Figure 4 Piriformis Stretch  - 2 x daily - 7 x weekly - 1 sets - 3 reps - 30s hold Added 04/20/22 - Sit to Stand Without Arm Support  - 2 x daily - 7 x weekly - 2 sets - 10 reps - Supine Active Straight Leg Raise  - 2 x daily - 7 x weekly - 2 sets - 10 reps - Seated Sciatic Tensioner  - 2 x daily - 7 x weekly - 2 sets - 10 reps    ASSESSMENT:   CLINICAL IMPRESSION: Patient presents to PT with continued pain in his R hip and R ankle, lateral side. Session today focused on proximal hip and RLE strengthening as well as stretching for ITB, hamstrings, and piriformis. He is somewhat limited by pain throughout session, needing to occasional change positions to relieve tension. Patient continues to benefit from skilled PT services and should be progressed as able to improve functional independence.   OBJECTIVE IMPAIRMENTS Abnormal gait, decreased activity tolerance, decreased knowledge of condition, decreased mobility, difficulty walking, decreased ROM, decreased strength, increased muscle spasms, impaired flexibility, and pain.    ACTIVITY LIMITATIONS bending, sitting, standing, sleeping, stairs, and locomotion level   PERSONAL FACTORS 1 comorbidity: PD  are also affecting patient's functional outcome.    REHAB POTENTIAL: Good   CLINICAL DECISION MAKING: Evolving/moderate complexity   EVALUATION COMPLEXITY: Moderate     GOALS: Goals reviewed with patient? No   SHORT TERM GOALS: Target date: 04/27/2022   Patient to demonstrate independence in HEP Baseline:[id number] Goal status: MET Pt reports adherence 04/23/22   2.  Assess lumbar mobility and set appropriate goal Baseline: TBD Goal status: INITIAL  3.  Patient to ambulate w/o need of crutches due to pain Baseline: Need of B crutches due to pain Goal status: INITIAL       LONG TERM GOALS: Target date: 05/11/2022   4/10 worst pain Baseline: 10/10 worst pain Goal status: INITIAL   2.  Increase FOTO score to 55 Baseline: 37 Goal status: INITIAL   3.  Increase 30s stand test to 5 or greater arms crossed from high table Baseline: 3 stands Goal status: INITIAL   4.  Increase BLE strength to 4+/5 in B hips/knees/ankles  Baseline: 4/5 B hip/knee/ankle strength Goal status: INITIAL       PLAN: PT FREQUENCY: 2x/week   PT DURATION: 4 weeks   PLANNED  INTERVENTIONS: Therapeutic exercises, Therapeutic activity, Neuromuscular re-education, Balance training, Gait training, Patient/Family education, Self Care, Joint mobilization, Stair training, DME instructions, Dry Needling, Manual therapy, and Re-evaluation.   PLAN FOR NEXT SESSION: HEP update, assess benefit of injection, gait training, stretch and strength of R hip, manual to R piriformis    Margarette Canada, PTA 04/26/2022, 3:16 PM

## 2022-04-26 NOTE — Progress Notes (Signed)
Bryan Sosa - 66 y.o. male MRN [id number]  Date of birth: November 03, 1955  Office Visit Note: Visit Date: 04/12/2022 PCP: Maury Dus, MD Referred by: Maury Dus, MD  Subjective: Chief Complaint  Patient presents with   Lower Back - Pain   Right Leg - Pain   HPI:  Bryan Sosa is a 66 y.o. male who comes in today at the request of Barnet Pall, FNP for planned Right L5-S1 Lumbar Interlaminar epidural steroid injection with fluoroscopic guidance.  The patient has failed conservative care including home exercise, medications, time and activity modification.  This injection will be diagnostic and hopefully therapeutic.  Please see requesting physician notes for further details and justification.   Speaking with him prior to the injection he really had some memory difficulties of what went over in the office visit and evaluation and management.  I think we should see him back in follow-up after the injection just to see how this does not go from there in terms of treatment plan.  His case is complicated by Parkinson's and some memory difficulty.  He sees Dr. Tomi Likens at [facility] neurology.   ROS Otherwise per HPI.  Assessment & Plan: Visit Diagnoses:    ICD-10-CM   1. Lumbar radiculopathy  M54.16 XR C-ARM NO REPORT    Epidural Steroid injection    methylPREDNISolone acetate (DEPO-MEDROL) injection 80 mg      Plan: No additional findings.   Meds & Orders:  Meds ordered this encounter  Medications   methylPREDNISolone acetate (DEPO-MEDROL) injection 80 mg    Orders Placed This Encounter  Procedures   XR C-ARM NO REPORT   Epidural Steroid injection    Follow-up: Return if symptoms worsen or fail to improve.   Procedures: No procedures performed  Lumbar Epidural Steroid Injection - Interlaminar Approach with Fluoroscopic Guidance  Patient: Bryan Sosa      Date of Birth: May 30, 1956 MRN: [id number] PCP: Maury Dus, MD      Visit Date: 04/12/2022   Universal  Protocol:     Consent Given By: the patient  Position: PRONE  Additional Comments: Vital signs were monitored before and after the procedure. Patient was prepped and draped in the usual sterile fashion. The correct patient, procedure, and site was verified.   Injection Procedure Details:   Procedure diagnoses: Lumbar radiculopathy [M54.16]   Meds Administered:  Meds ordered this encounter  Medications   methylPREDNISolone acetate (DEPO-MEDROL) injection 80 mg     Laterality: Right  Location/Site:  L5-S1  Needle: 3.5 in., 20 ga. Tuohy  Needle Placement: Paramedian epidural  Findings:   -Comments: Excellent flow of contrast into the epidural space.  Procedure Details: Using a paramedian approach from the side mentioned above, the region overlying the inferior lamina was localized under fluoroscopic visualization and the soft tissues overlying this structure were infiltrated with 4 ml. of 1% Lidocaine without Epinephrine. The Tuohy needle was inserted into the epidural space using a paramedian approach.   The epidural space was localized using loss of resistance along with counter oblique bi-planar fluoroscopic views.  After negative aspirate for air, blood, and CSF, a 2 ml. volume of Isovue-250 was injected into the epidural space and the flow of contrast was observed. Radiographs were obtained for documentation purposes.    The injectate was administered into the level noted above.   Additional Comments:  The patient tolerated the procedure well Dressing: 2 x 2 sterile gauze and Band-Aid    Post-procedure details: Patient was observed  during the procedure. Post-procedure instructions were reviewed.  Patient left the clinic in stable condition.   Clinical History: No specialty comments available.     Objective:  VS:  HT:    WT:   BMI:     BP:137/87  HR:75bpm  TEMP: ( )  RESP:  Physical Exam Vitals and nursing note reviewed.  Constitutional:       General: He is not in acute distress.    Appearance: Normal appearance. He is not ill-appearing.  HENT:     Head: Normocephalic and atraumatic.     Right Ear: External ear normal.     Left Ear: External ear normal.     Nose: No congestion.  Eyes:     Extraocular Movements: Extraocular movements intact.  Cardiovascular:     Rate and Rhythm: Normal rate.     Pulses: Normal pulses.  Pulmonary:     Effort: Pulmonary effort is normal. No respiratory distress.  Abdominal:     General: There is no distension.     Palpations: Abdomen is soft.  Musculoskeletal:        General: No tenderness or signs of injury.     Cervical back: Neck supple.     Right lower leg: No edema.     Left lower leg: No edema.     Comments: Patient has good distal strength without clonus.  Skin:    Findings: No erythema or rash.  Neurological:     General: No focal deficit present.     Mental Status: He is alert and oriented to person, place, and time.     Sensory: No sensory deficit.     Motor: No weakness or abnormal muscle tone.     Coordination: Coordination normal.  Psychiatric:        Mood and Affect: Mood normal.        Behavior: Behavior normal.      Imaging: No results found.

## 2022-04-26 NOTE — Procedures (Signed)
Lumbar Epidural Steroid Injection - Interlaminar Approach with Fluoroscopic Guidance  Patient: Bryan Sosa      Date of Birth: 07-Dec-1955 MRN: [id number] PCP: Maury Dus, MD      Visit Date: 04/12/2022   Universal Protocol:     Consent Given By: the patient  Position: PRONE  Additional Comments: Vital signs were monitored before and after the procedure. Patient was prepped and draped in the usual sterile fashion. The correct patient, procedure, and site was verified.   Injection Procedure Details:   Procedure diagnoses: Lumbar radiculopathy [M54.16]   Meds Administered:  Meds ordered this encounter  Medications   methylPREDNISolone acetate (DEPO-MEDROL) injection 80 mg     Laterality: Right  Location/Site:  L5-S1  Needle: 3.5 in., 20 ga. Tuohy  Needle Placement: Paramedian epidural  Findings:   -Comments: Excellent flow of contrast into the epidural space.  Procedure Details: Using a paramedian approach from the side mentioned above, the region overlying the inferior lamina was localized under fluoroscopic visualization and the soft tissues overlying this structure were infiltrated with 4 ml. of 1% Lidocaine without Epinephrine. The Tuohy needle was inserted into the epidural space using a paramedian approach.   The epidural space was localized using loss of resistance along with counter oblique bi-planar fluoroscopic views.  After negative aspirate for air, blood, and CSF, a 2 ml. volume of Isovue-250 was injected into the epidural space and the flow of contrast was observed. Radiographs were obtained for documentation purposes.    The injectate was administered into the level noted above.   Additional Comments:  The patient tolerated the procedure well Dressing: 2 x 2 sterile gauze and Band-Aid    Post-procedure details: Patient was observed during the procedure. Post-procedure instructions were reviewed.  Patient left the clinic in stable condition.

## 2022-04-26 NOTE — Addendum Note (Signed)
Addended by: Raymondo Band on: 04/26/2022 10:35 AM   Modules accepted: Orders

## 2022-04-28 ENCOUNTER — Ambulatory Visit: Payer: Medicare Other

## 2022-04-28 DIAGNOSIS — M5431 Sciatica, right side: Secondary | ICD-10-CM | POA: Diagnosis not present

## 2022-04-28 DIAGNOSIS — M6281 Muscle weakness (generalized): Secondary | ICD-10-CM

## 2022-04-28 DIAGNOSIS — G20 Parkinson's disease: Secondary | ICD-10-CM

## 2022-04-28 DIAGNOSIS — M5459 Other low back pain: Secondary | ICD-10-CM

## 2022-04-28 NOTE — Therapy (Signed)
OUTPATIENT PHYSICAL THERAPY TREATMENT NOTE   Patient Name: Bryan Sosa MRN: [id number] DOB:02/05/1956, 66 y.o., male Today's Date: 04/28/2022  PCP: Maury Dus, MD REFERRING PROVIDER: Almedia Balls, NP  END OF SESSION:   PT End of Session - 04/28/22 1431     Visit Number 5    Number of Visits 8    Date for PT Re-Evaluation 06/08/22    Authorization Type [employer]    PT Start Time 8638    PT Stop Time 1771    PT Time Calculation (min) 41 min    Activity Tolerance Patient tolerated treatment well;Patient limited by pain    Behavior During Therapy [facility] for tasks assessed/performed              Past Medical History:  Diagnosis Date   Anxiety    Depression    ED (erectile dysfunction)    GERD (gastroesophageal reflux disease)    Headache    migraines   History of kidney stones    Hypertension    Insomnia    PVC (premature ventricular contraction)    Past Surgical History:  Procedure Laterality Date   bladder neck obstruction     2008   XI ROBOTIC ASSISTED SIMPLE PROSTATECTOMY N/A 10/13/2021   Procedure: XI ROBOTIC ASSISTED SIMPLE PROSTATECTOMY;  Surgeon: Alexis Frock, MD;  Location: [facility] ORS;  Service: Urology;  Laterality: N/A;   Patient Active Problem List   Diagnosis Date Noted   BPH (benign prostatic hyperplasia) 10/13/2021   Mixed conductive and sensorineural hearing loss of left ear with restricted hearing of right ear 12/17/2020   OCD (obsessive compulsive disorder) 08/08/2018   Social anxiety disorder 08/08/2018   Insomnia 08/08/2018   Pain in the chest 05/07/2015   Obesity 05/07/2015   Family history of early CAD 05/07/2015   Essential hypertension 05/07/2015    REFERRING DIAG: M54.16 (ICD-10-CM) - Lumbar radiculopathy M54.41,G89.29 (ICD-10-CM) - Chronic right-sided low back pain with right-sided sciatica M47.816 (ICD-10-CM) - Facet arthropathy, lumbar   THERAPY DIAG:  Sciatica, right side  Other low back pain  Muscle weakness  (generalized)  Parkinson's disease ([place])  Rationale for Evaluation and Treatment Rehabilitation  PERTINENT HISTORY: HPI: COLBY REELS is a 66 y.o. male who comes in today per the request of Almedia Balls, NP for evaluation of chronic, worsening and severe right sided lower back pain radiating to buttock, hip and down right lateral leg. Daughter is accompanying him during our visit today. Pain ongoing for several weeks, worsened after exercising on rowing machine in gym. Patient states his pain has gradually increased over the last several week, states he is now using crutches to ambulate due to severe pain. He describes pain as a sore, aching and burning sensation, currently rates as 8 out of 10. Patient reports some relief of pain with home exercise regimen, rest and use of medications. Some relief of pain with recent Flexeril, Meloxicam and Prednisone taper. No history of lumbar injections/lumbar surgery. Patient does have history of Parkinson's Disease and is currently being managed by Dr. Metta Clines at [facility]. Patient denies focal weakness, numbness and tingling. Patient denies recent trauma or falls.   PRECAUTIONS: Back  SUBJECTIVE: Patient reports he had a hard time sleeping last night due to aching/burning pain in his R hip.  PAIN:  Are you having pain? Yes: NPRS scale: 4/10 (8/10 last night) Pain location: R buttock Pain description: ache, cramp Aggravating factors: prolonged sitting and pressure on R buttock Relieving factors: position changes and  meds   OBJECTIVE: (objective measures completed at initial evaluation unless otherwise dated)   DIAGNOSTIC FINDINGS:  Radiographs exhibit leftward curvature with no listhesis, multilevel  degenerative disc height loss, facet arthropathy and biforaminal narrowing  at L5-S1. No fractures or dislocations noted.   PATIENT SURVEYS:  FOTO 37(55 predicted)   SCREENING FOR RED FLAGS: Bowel or bladder incontinence: No      COGNITION:           Overall cognitive status: Within functional limits for tasks assessed                          SENSATION: Not tested   MUSCLE LENGTH: Hamstrings: Right 50 deg; Left 70 deg Thomas test: Right positive for paresthesias in ankle R   POSTURE:  not tested    PALPATION: TTP R piriformis    LUMBAR ROM: Deferred due to pain   Active  A/PROM  eval  Flexion    Extension    Right lateral flexion    Left lateral flexion    Right rotation    Left rotation     (Blank rows = not tested)   LOWER EXTREMITY ROM:   PROM WFL in B hips   Passive  Right eval Left eval  Hip flexion      Hip extension      Hip abduction      Hip adduction      Hip internal rotation      Hip external rotation      Knee flexion      Knee extension      Ankle dorsiflexion      Ankle plantarflexion      Ankle inversion      Ankle eversion       (Blank rows = not tested)   LOWER EXTREMITY MMT:     MMT Right eval Left eval  Hip flexion 4 4  Hip extension 4 4  Hip abduction      Hip adduction      Hip internal rotation      Hip external rotation      Knee flexion      Knee extension 4 4  Ankle dorsiflexion      Ankle plantarflexion 4 4  Ankle inversion      Ankle eversion       (Blank rows = not tested)   LUMBAR SPECIAL TESTS:  Straight leg raise test: Positive, Slump test: Negative, FABER test: Negative, and Thomas test: Positive   FUNCTIONAL TESTS:  30s stand test 3 reps   GAIT: Distance walked: 49fx2 Assistive device utilized: Crutches Level of assistance: Modified independence Comments: antalgic gait       TODAY'S TREATMENT  [facility] Adult PT Treatment:                                                DATE: 04/28/2022 Therapeutic Exercise: Nustep level 5 x 5 mins Omega knee extension 10# 2x10 BIL, 5# Rt only 2x10 Omega knee flexion 30# 2x10  Seated marching 2x1' Seated hamstring stretch 2x30" Rt STS with OH reach 2x5 Seated figure 4 stretch 2x30" BIL SLR  x5 Lt (terminated early due to increase in R hip pain) Supine ITB stretch with strap 2x30" Seated sciatic nerve glide x10 Rt   [facility] Adult PT Treatment:  DATE: 04/26/2022 Therapeutic Exercise: Nustep level 5 x 5 mins Omega knee extension 10# 2x10 BIL, 5# Rt only 2x10 Omega knee flexion 25# 2x10  Seated hamstring stretch 2x30" Rt STS with OH reach 2x5 Supine clamshell GTB 2x10 Supine marching against GTB 2x10 BIL Supine figure 4 stretch 2x30" BIL Sidelying hip abduction Rt small range 2x10 SLR 2x10 BIL Lt sidelying ITB stretch Rt LE off EOM with Rt UE OH reach x30" (increase in pain from knee to ankle) Seated sciatic nerve glide x10 Rt  [facility] Adult PT Treatment:                                                DATE: 04/23/2022 Therapeutic Exercise: Nustep level 5 x 5 mins Omega knee extension 10# 2x10 BIL, 5# Rt only 2x10 Omega knee flexion 25# 2x10  Standing marching against wall x10 BIL (increase in pain) STS with OH reach x10 Supine clamshell GTB 2x10 Supine marching against GTB 2x10 BIL Supine figure 4 stretch 2x30" Rt Sidelying hip abduction Rt small range 2x10 SLR 2x10 BIL    PATIENT EDUCATION:  Education details: Discussed eval findings, rehab rationale and POC and patient is in agreement  Person educated: Patient Education method: Explanation and Handouts Education comprehension: verbalized understanding and needs further education     HOME EXERCISE PROGRAM: Access Code: [email or web address] URL: [email or web address] Date: 04/13/2022 Prepared by: Sharlynn Oliphant   Exercises - Clamshell  - 2 x daily - 7 x weekly - 2 sets - 15 reps - Supine Figure 4 Piriformis Stretch  - 2 x daily - 7 x weekly - 1 sets - 3 reps - 30s hold Added 04/20/22 - Sit to Stand Without Arm Support  - 2 x daily - 7 x weekly - 2 sets - 10 reps - Supine Active Straight Leg Raise  - 2 x daily - 7 x weekly - 2 sets - 10 reps - Seated Sciatic  Tensioner  - 2 x daily - 7 x weekly - 2 sets - 10 reps   ASSESSMENT:   CLINICAL IMPRESSION: Patient presents to PT with continued pain in his R hip and ankle and reports difficulty sleeping last night due to increased pain, 8/10. Session today continued to focus on proximal hip strengthening and stretching for ITB, hamstrings, and piriformis. He remains limited by pain throughout session, needing to terminate SLR early due to sharp pain in R hip. Performed remaining exercises in sitting to decrease pain. Patient continues to benefit from skilled PT services and should be progressed as able to improve functional independence.   OBJECTIVE IMPAIRMENTS Abnormal gait, decreased activity tolerance, decreased knowledge of condition, decreased mobility, difficulty walking, decreased ROM, decreased strength, increased muscle spasms, impaired flexibility, and pain.    ACTIVITY LIMITATIONS bending, sitting, standing, sleeping, stairs, and locomotion level   PERSONAL FACTORS 1 comorbidity: PD  are also affecting patient's functional outcome.    REHAB POTENTIAL: Good   CLINICAL DECISION MAKING: Evolving/moderate complexity   EVALUATION COMPLEXITY: Moderate     GOALS: Goals reviewed with patient? No   SHORT TERM GOALS: Target date: 04/27/2022   Patient to demonstrate independence in HEP Baseline:[email or web address] Goal status: MET Pt reports adherence 04/23/22   2.  Assess lumbar mobility and set appropriate goal Baseline: TBD Goal status: INITIAL   3.  Patient to ambulate w/o need of crutches due to pain  Baseline: Need of B crutches due to pain Goal status: Ongoing 04/28/22: Patient still ambulating with BIL crutches       LONG TERM GOALS: Target date: 05/11/2022   4/10 worst pain Baseline: 10/10 worst pain Goal status: INITIAL   2.  Increase FOTO score to 55 Baseline: 37 Goal status: INITIAL   3.  Increase 30s stand test to 5 or greater arms crossed from high table Baseline: 3 stands Goal  status: INITIAL   4.  Increase BLE strength to 4+/5 in B hips/knees/ankles  Baseline: 4/5 B hip/knee/ankle strength Goal status: INITIAL       PLAN: PT FREQUENCY: 2x/week   PT DURATION: 4 weeks   PLANNED INTERVENTIONS: Therapeutic exercises, Therapeutic activity, Neuromuscular re-education, Balance training, Gait training, Patient/Family education, Self Care, Joint mobilization, Stair training, DME instructions, Dry Needling, Manual therapy, and Re-evaluation.   PLAN FOR NEXT SESSION: HEP update, assess benefit of injection, gait training, stretch and strength of R hip, manual to R piriformis    Margarette Canada, PTA 04/28/2022, 3:18 PM

## 2022-05-03 ENCOUNTER — Ambulatory Visit: Payer: Medicare Other | Attending: Family Medicine

## 2022-05-03 DIAGNOSIS — M5459 Other low back pain: Secondary | ICD-10-CM | POA: Insufficient documentation

## 2022-05-03 DIAGNOSIS — M6281 Muscle weakness (generalized): Secondary | ICD-10-CM | POA: Insufficient documentation

## 2022-05-03 DIAGNOSIS — M5431 Sciatica, right side: Secondary | ICD-10-CM | POA: Diagnosis present

## 2022-05-03 DIAGNOSIS — G20 Parkinson's disease: Secondary | ICD-10-CM | POA: Diagnosis present

## 2022-05-03 NOTE — Therapy (Signed)
OUTPATIENT PHYSICAL THERAPY TREATMENT NOTE   Patient Name: Bryan Sosa MRN: [id number] DOB:[birth year], 66 y.o., male Today's Date: 05/03/2022  PCP: Bryan Dus, MD REFERRING PROVIDER: Almedia Balls, NP  END OF SESSION:   PT End of Session - 05/03/22 1246     Visit Number 6    Number of Visits 8    Date for PT Re-Evaluation 06/08/22    Authorization Type U[employer]    PT Start Time 1300    PT Stop Time 1340    PT Time Calculation (min) 40 min    Activity Tolerance Patient tolerated treatment well;Patient limited by pain    Behavior During Therapy [facility] for tasks assessed/performed            Past Medical History:  Diagnosis Date   Anxiety    Depression    ED (erectile dysfunction)    GERD (gastroesophageal reflux disease)    Headache    migraines   History of kidney stones    Hypertension    Insomnia    PVC (premature ventricular contraction)    Past Surgical History:  Procedure Laterality Date   bladder neck obstruction     2008   XI ROBOTIC ASSISTED SIMPLE PROSTATECTOMY N/A 10/13/2021   Procedure: XI ROBOTIC ASSISTED SIMPLE PROSTATECTOMY;  Surgeon: Bryan Frock, MD;  Location: [facility];  Service: Urology;  Laterality: N/A;   Patient Active Problem List   Diagnosis Date Noted   BPH (benign prostatic hyperplasia) 10/13/2021   Mixed conductive and sensorineural hearing loss of left ear with restricted hearing of right ear 12/17/2020   OCD (obsessive compulsive disorder) 08/08/2018   Social anxiety disorder 08/08/2018   Insomnia 08/08/2018   Pain in the chest 05/07/2015   Obesity 05/07/2015   Family history of early CAD 05/07/2015   Essential hypertension 05/07/2015    REFERRING DIAG: M54.16 (ICD-10-CM) - Lumbar radiculopathy M54.41,G89.29 (ICD-10-CM) - Chronic right-sided low back pain with right-sided sciatica M47.816 (ICD-10-CM) - Facet arthropathy, lumbar   THERAPY DIAG:  Sciatica, right side  Other low back pain  Muscle weakness  (generalized)  Parkinson's disease ([place])  Rationale for Evaluation and Treatment Rehabilitation  PERTINENT HISTORY: HPI: Bryan Sosa is a 66 y.o. male who comes in today per the request of Bryan Balls, NP for evaluation of chronic, worsening and severe right sided lower back pain radiating to buttock, hip and down right lateral leg. Daughter is accompanying him during our visit today. Pain ongoing for several weeks, worsened after exercising on rowing machine in gym. Patient states his pain has gradually increased over the last several week, states he is now using crutches to ambulate due to severe pain. He describes pain as a sore, aching and burning sensation, currently rates as 8 out of 10. Patient reports some relief of pain with home exercise regimen, rest and use of medications. Some relief of pain with recent Flexeril, Meloxicam and Prednisone taper. No history of lumbar injections/lumbar surgery. Patient does have history of Parkinson's Disease and is currently being managed by Bryan Sosa at [facility]. Patient denies focal weakness, numbness and tingling. Patient denies recent trauma or falls.   PRECAUTIONS: Back  SUBJECTIVE: Patient reports that he is having trouble sleeping due to muscle cramps in his R hip. He asked if we can write prescriptions here, advised him to follow up with his MD regarding medications.  PAIN:  Are you having pain? Yes: NPRS scale: 4/10 (10/10 when trying to sleep) Pain location: R buttock Pain description:  ache, cramp Aggravating factors: prolonged sitting and pressure on R buttock Relieving factors: position changes and meds   OBJECTIVE: (objective measures completed at initial evaluation unless otherwise dated)   DIAGNOSTIC FINDINGS:  Radiographs exhibit leftward curvature with no listhesis, multilevel  degenerative disc height loss, facet arthropathy and biforaminal narrowing  at L5-S1. No fractures or dislocations noted.   PATIENT  SURVEYS:  FOTO 37(55 predicted)   SCREENING FOR RED FLAGS: Bowel or bladder incontinence: No     COGNITION:           Overall cognitive status: Within functional limits for tasks assessed                          SENSATION: Not tested   MUSCLE LENGTH: Hamstrings: Right 50 deg; Left 70 deg Thomas test: Right positive for paresthesias in ankle R   POSTURE:  not tested    PALPATION: TTP R piriformis    LUMBAR ROM: Deferred due to pain   Active  A/PROM  eval  Flexion    Extension    Right lateral flexion    Left lateral flexion    Right rotation    Left rotation     (Blank rows = not tested)   LOWER EXTREMITY ROM:   PROM WFL in B hips   Passive  Right eval Left eval  Hip flexion      Hip extension      Hip abduction      Hip adduction      Hip internal rotation      Hip external rotation      Knee flexion      Knee extension      Ankle dorsiflexion      Ankle plantarflexion      Ankle inversion      Ankle eversion       (Blank rows = not tested)   LOWER EXTREMITY MMT:     MMT Right eval Left eval  Hip flexion 4 4  Hip extension 4 4  Hip abduction      Hip adduction      Hip internal rotation      Hip external rotation      Knee flexion      Knee extension 4 4  Ankle dorsiflexion      Ankle plantarflexion 4 4  Ankle inversion      Ankle eversion       (Blank rows = not tested)   LUMBAR SPECIAL TESTS:  Straight leg raise test: Positive, Slump test: Negative, FABER test: Negative, and Thomas test: Positive   FUNCTIONAL TESTS:  30s stand test 3 reps   GAIT: Distance walked: 75ftx2 Assistive device utilized: Crutches Level of assistance: Modified independence Comments: antalgic gait       TODAY'S TREATMENT  [facility] Adult PT Treatment:                                                DATE: 05/03/2022 Therapeutic Exercise: Nustep level 5 x 5 mins Seated marching 2x1' Seated hamstring stretch 2x30" Rt STS with OH reach 2x5 Seated sciatic nerve  glide x15 Rt Blue pball roll outs forward/lateral x10 each Seated figure 4 push/pull stretch 2x30" each BIL  [facility] Adult PT Treatment:                                                  DATE: 04/28/2022 Therapeutic Exercise: Nustep level 5 x 5 mins Omega knee extension 10# 2x10 BIL, 5# Rt only 2x10 Omega knee flexion 30# 2x10  Seated marching 2x1' Seated hamstring stretch 2x30" Rt STS with OH reach 2x5 Seated figure 4 stretch 2x30" BIL SLR x5 Lt (terminated early due to increase in R hip pain) Supine ITB stretch with strap 2x30" Seated sciatic nerve glide x10 Rt   [facility] Adult PT Treatment:                                                DATE: 04/26/2022 Therapeutic Exercise: Nustep level 5 x 5 mins Omega knee extension 10# 2x10 BIL, 5# Rt only 2x10 Omega knee flexion 25# 2x10  Seated hamstring stretch 2x30" Rt STS with OH reach 2x5 Supine clamshell GTB 2x10 Supine marching against GTB 2x10 BIL Supine figure 4 stretch 2x30" BIL Sidelying hip abduction Rt small range 2x10 SLR 2x10 BIL Lt sidelying ITB stretch Rt LE off EOM with Rt UE OH reach x30" (increase in pain from knee to ankle) Seated sciatic nerve glide x10 Rt  PATIENT EDUCATION:  Education details: Discussed eval findings, rehab rationale and POC and patient is in agreement  Person educated: Patient Education method: Explanation and Handouts Education comprehension: verbalized understanding and needs further education     HOME EXERCISE PROGRAM: Access Code: [email or web address] URL: [email or web address] Date: 04/13/2022 Prepared by: Jeffrey Ziemba   Exercises - Clamshell  - 2 x daily - 7 x weekly - 2 sets - 15 reps - Supine Figure 4 Piriformis Stretch  - 2 x daily - 7 x weekly - 1 sets - 3 reps - 30s hold Added 04/20/22 - Sit to Stand Without Arm Support  - 2 x daily - 7 x weekly - 2 sets - 10 reps - Supine Active Straight Leg Raise  - 2 x daily - 7 x weekly - 2 sets - 10 reps - Seated Sciatic Tensioner  - 2 x  daily - 7 x weekly - 2 sets - 10 reps   ASSESSMENT:   CLINICAL IMPRESSION: Patient presents to PT with continued pain in his R hip and R ankle and reports difficulty sleeping due to muscle cramps and pain in his hip. He reports that when he tries to ambulate off of BIL crutches, his pain becomes unbearable. He inquired about medication to help his pain and sleeping and was advised to follow up with his doctor. Session today focused on proximal hip strengthening and stretching. Attempted to introduce prone prop on elbows this session but patient was unable to get into position due to pain in R hip. Patient continues to benefit from skilled PT services and should be progressed as able to improve functional independence.   OBJECTIVE IMPAIRMENTS Abnormal gait, decreased activity tolerance, decreased knowledge of condition, decreased mobility, difficulty walking, decreased ROM, decreased strength, increased muscle spasms, impaired flexibility, and pain.    ACTIVITY LIMITATIONS bending, sitting, standing, sleeping, stairs, and locomotion level   PERSONAL FACTORS 1 comorbidity: PD  are also affecting patient's functional outcome.    REHAB POTENTIAL: Good   CLINICAL DECISION MAKING: Evolving/moderate complexity   EVALUATION COMPLEXITY: Moderate     GOALS: Goals reviewed with patient? No   SHORT TERM GOALS: Target date: 04/27/2022   Patient to demonstrate independence in HEP Baseline:[email or web address] Goal status: MET Pt reports adherence 04/23/22     2.  Assess lumbar mobility and set appropriate goal Baseline: TBD Goal status: INITIAL   3.  Patient to ambulate w/o need of crutches due to pain Baseline: Need of B crutches due to pain Goal status: Ongoing 04/28/22: Patient still ambulating with BIL crutches       LONG TERM GOALS: Target date: 05/11/2022   4/10 worst pain Baseline: 10/10 worst pain Goal status: INITIAL   2.  Increase FOTO score to 55 Baseline: 37 Goal status: INITIAL   3.   Increase 30s stand test to 5 or greater arms crossed from high table Baseline: 3 stands Goal status: INITIAL   4.  Increase BLE strength to 4+/5 in B hips/knees/ankles  Baseline: 4/5 B hip/knee/ankle strength Goal status: INITIAL       PLAN: PT FREQUENCY: 2x/week   PT DURATION: 4 weeks   PLANNED INTERVENTIONS: Therapeutic exercises, Therapeutic activity, Neuromuscular re-education, Balance training, Gait training, Patient/Family education, Self Care, Joint mobilization, Stair training, DME instructions, Dry Needling, Manual therapy, and Re-evaluation.   PLAN FOR NEXT SESSION: HEP update, assess benefit of injection, gait training, stretch and strength of R hip, manual to R piriformis     Williams, PTA 05/03/2022, 1:41 PM    

## 2022-05-04 NOTE — Therapy (Signed)
OUTPATIENT PHYSICAL THERAPY TREATMENT NOTE   Patient Name: CHAN SHEAHAN MRN: [id number] DOB:1956-06-05, 66 y.o., male Today's Date: 05/05/2022  PCP: Maury Dus, MD REFERRING PROVIDER: Almedia Balls, NP  END OF SESSION:   PT End of Session - 05/05/22 1257     Visit Number 7    Number of Visits 8    Date for PT Re-Evaluation 06/08/22    Authorization Type UHC    PT Start Time 1300    PT Stop Time 1340    PT Time Calculation (min) 40 min    Activity Tolerance Patient tolerated treatment well;Patient limited by pain    Behavior During Therapy [facility] for tasks assessed/performed             Past Medical History:  Diagnosis Date   Anxiety    Depression    ED (erectile dysfunction)    GERD (gastroesophageal reflux disease)    Headache    migraines   History of kidney stones    Hypertension    Insomnia    PVC (premature ventricular contraction)    Past Surgical History:  Procedure Laterality Date   bladder neck obstruction     2008   XI ROBOTIC ASSISTED SIMPLE PROSTATECTOMY N/A 10/13/2021   Procedure: XI ROBOTIC ASSISTED SIMPLE PROSTATECTOMY;  Surgeon: Alexis Frock, MD;  Location: [facility];  Service: Urology;  Laterality: N/A;   Patient Active Problem List   Diagnosis Date Noted   BPH (benign prostatic hyperplasia) 10/13/2021   Mixed conductive and sensorineural hearing loss of left ear with restricted hearing of right ear 12/17/2020   OCD (obsessive compulsive disorder) 08/08/2018   Social anxiety disorder 08/08/2018   Insomnia 08/08/2018   Pain in the chest 05/07/2015   Obesity 05/07/2015   Family history of early CAD 05/07/2015   Essential hypertension 05/07/2015    REFERRING DIAG: M54.16 (ICD-10-CM) - Lumbar radiculopathy M54.41,G89.29 (ICD-10-CM) - Chronic right-sided low back pain with right-sided sciatica M47.816 (ICD-10-CM) - Facet arthropathy, lumbar   THERAPY DIAG:  Sciatica, right side  Other low back pain  Muscle weakness  (generalized)  Parkinson's disease ([place])  Rationale for Evaluation and Treatment Rehabilitation  PERTINENT HISTORY: HPI: RAMADAN COUEY is a 66 y.o. male who comes in today per the request of Almedia Balls, NP for evaluation of chronic, worsening and severe right sided lower back pain radiating to buttock, hip and down right lateral leg. Daughter is accompanying him during our visit today. Pain ongoing for several weeks, worsened after exercising on rowing machine in gym. Patient states his pain has gradually increased over the last several week, states he is now using crutches to ambulate due to severe pain. He describes pain as a sore, aching and burning sensation, currently rates as 8 out of 10. Patient reports some relief of pain with home exercise regimen, rest and use of medications. Some relief of pain with recent Flexeril, Meloxicam and Prednisone taper. No history of lumbar injections/lumbar surgery. Patient does have history of Parkinson's Disease and is currently being managed by Dr. Metta Clines at [facility]. Patient denies focal weakness, numbness and tingling. Patient denies recent trauma or falls.   PRECAUTIONS: Back  SUBJECTIVE: Patient reports continued pain, especially when sleeping where he often gets muscle cramps.  PAIN:  Are you having pain? Yes: NPRS scale: 2/10 (10/10 when trying to sleep) Pain location: R buttock Pain description: ache, cramp Aggravating factors: prolonged sitting and pressure on R buttock Relieving factors: position changes and meds   OBJECTIVE: (  objective measures completed at initial evaluation unless otherwise dated)   DIAGNOSTIC FINDINGS:  Radiographs exhibit leftward curvature with no listhesis, multilevel  degenerative disc height loss, facet arthropathy and biforaminal narrowing  at L5-S1. No fractures or dislocations noted.   PATIENT SURVEYS:  FOTO 37(55 predicted)   SCREENING FOR RED FLAGS: Bowel or bladder incontinence: No      COGNITION:           Overall cognitive status: Within functional limits for tasks assessed                          SENSATION: Not tested   MUSCLE LENGTH: Hamstrings: Right 50 deg; Left 70 deg Thomas test: Right positive for paresthesias in ankle R   POSTURE:  not tested    PALPATION: TTP R piriformis    LUMBAR ROM: Deferred due to pain   Active  A/PROM  eval  Flexion    Extension    Right lateral flexion    Left lateral flexion    Right rotation    Left rotation     (Blank rows = not tested)   LOWER EXTREMITY ROM:   PROM WFL in B hips   Passive  Right eval Left eval  Hip flexion      Hip extension      Hip abduction      Hip adduction      Hip internal rotation      Hip external rotation      Knee flexion      Knee extension      Ankle dorsiflexion      Ankle plantarflexion      Ankle inversion      Ankle eversion       (Blank rows = not tested)   LOWER EXTREMITY MMT:     MMT Right eval Left eval  Hip flexion 4 4  Hip extension 4 4  Hip abduction      Hip adduction      Hip internal rotation      Hip external rotation      Knee flexion      Knee extension 4 4  Ankle dorsiflexion      Ankle plantarflexion 4 4  Ankle inversion      Ankle eversion       (Blank rows = not tested)   LUMBAR SPECIAL TESTS:  Straight leg raise test: Positive, Slump test: Negative, FABER test: Negative, and Thomas test: Positive   FUNCTIONAL TESTS:  30s stand test 3 reps   GAIT: Distance walked: 20fx2 Assistive device utilized: Crutches Level of assistance: Modified independence Comments: antalgic gait       TODAY'S TREATMENT  [facility] Adult PT Treatment:                                                DATE: 05/05/2022 Therapeutic Exercise: Nustep level 5 x 6 mins Seated marching 2x1' Seated hamstring stretch 2x30" Rt STS with OH reach 2x5 Seated sciatic nerve glide x15 Rt Blue pball roll outs forward/lateral x10 each Seated figure 4 push/pull stretch  2x30" each BIL Standing heel raise 1x10, 1x5 with UE support (painful)  [facility] Adult PT Treatment:  DATE: 05/03/2022 Therapeutic Exercise: Nustep level 5 x 5 mins Seated marching 2x1' Seated hamstring stretch 2x30" Rt STS with OH reach 2x5 Seated sciatic nerve glide x15 Rt Blue pball roll outs forward/lateral x10 each Seated figure 4 push/pull stretch 2x30" each BIL  [facility] Adult PT Treatment:                                                DATE: 04/28/2022 Therapeutic Exercise: Nustep level 5 x 5 mins Omega knee extension 10# 2x10 BIL, 5# Rt only 2x10 Omega knee flexion 30# 2x10  Seated marching 2x1' Seated hamstring stretch 2x30" Rt STS with OH reach 2x5 Seated figure 4 stretch 2x30" BIL SLR x5 Lt (terminated early due to increase in R hip pain) Supine ITB stretch with strap 2x30" Seated sciatic nerve glide x10 Rt   PATIENT EDUCATION:  Education details: Discussed eval findings, rehab rationale and POC and patient is in agreement  Person educated: Patient Education method: Explanation and Handouts Education comprehension: verbalized understanding and needs further education     HOME EXERCISE PROGRAM: Access Code: [email or web address] URL: [email or web address] Date: 04/13/2022 Prepared by: Sharlynn Oliphant   Exercises - Clamshell  - 2 x daily - 7 x weekly - 2 sets - 15 reps - Supine Figure 4 Piriformis Stretch  - 2 x daily - 7 x weekly - 1 sets - 3 reps - 30s hold Added 04/20/22 - Sit to Stand Without Arm Support  - 2 x daily - 7 x weekly - 2 sets - 10 reps - Supine Active Straight Leg Raise  - 2 x daily - 7 x weekly - 2 sets - 10 reps - Seated Sciatic Tensioner  - 2 x daily - 7 x weekly - 2 sets - 10 reps   ASSESSMENT:   CLINICAL IMPRESSION: Patient presents to PT with continued pain in his R hip, stating it is worst at night when he gets muscle cramps in the hip. He ambulates on bilateral crutches, when attempting to ambulate  without them, he is only able to take a few steps before the pain is too high to continue. Session today continued to focus on proximal hip strengthening and stretching in positions that do not exacerbate the pain. He remains limited by hip, LE and ankle pain throughout session. Patient continues to benefit from skilled PT services and should be progressed as able to improve functional independence.   OBJECTIVE IMPAIRMENTS Abnormal gait, decreased activity tolerance, decreased knowledge of condition, decreased mobility, difficulty walking, decreased ROM, decreased strength, increased muscle spasms, impaired flexibility, and pain.    ACTIVITY LIMITATIONS bending, sitting, standing, sleeping, stairs, and locomotion level   PERSONAL FACTORS 1 comorbidity: PD  are also affecting patient's functional outcome.    REHAB POTENTIAL: Good   CLINICAL DECISION MAKING: Evolving/moderate complexity   EVALUATION COMPLEXITY: Moderate     GOALS: Goals reviewed with patient? No   SHORT TERM GOALS: Target date: 04/27/2022   Patient to demonstrate independence in HEP Baseline:[email or web address] Goal status: MET Pt reports adherence 04/23/22   2.  Assess lumbar mobility and set appropriate goal Baseline: TBD Goal status: INITIAL   3.  Patient to ambulate w/o need of crutches due to pain Baseline: Need of B crutches due to pain Goal status: Ongoing 04/28/22: Patient still ambulating with BIL crutches       LONG TERM GOALS:  Target date: 05/11/2022   4/10 worst pain Baseline: 10/10 worst pain Goal status: INITIAL   2.  Increase FOTO score to 55 Baseline: 37 Goal status: INITIAL   3.  Increase 30s stand test to 5 or greater arms crossed from high table Baseline: 3 stands Goal status: INITIAL   4.  Increase BLE strength to 4+/5 in B hips/knees/ankles  Baseline: 4/5 B hip/knee/ankle strength Goal status: INITIAL       PLAN: PT FREQUENCY: 2x/week   PT DURATION: 4 weeks   PLANNED INTERVENTIONS:  Therapeutic exercises, Therapeutic activity, Neuromuscular re-education, Balance training, Gait training, Patient/Family education, Self Care, Joint mobilization, Stair training, DME instructions, Dry Needling, Manual therapy, and Re-evaluation.   PLAN FOR NEXT SESSION: HEP update, assess benefit of injection, gait training, stretch and strength of R hip, manual to R piriformis    Margarette Canada, PTA 05/05/2022, 1:41 PM

## 2022-05-05 ENCOUNTER — Ambulatory Visit: Payer: Medicare Other | Admitting: Physical Medicine and Rehabilitation

## 2022-05-05 ENCOUNTER — Encounter: Payer: Self-pay | Admitting: Physical Medicine and Rehabilitation

## 2022-05-05 ENCOUNTER — Ambulatory Visit: Payer: Medicare Other

## 2022-05-05 ENCOUNTER — Ambulatory Visit (INDEPENDENT_AMBULATORY_CARE_PROVIDER_SITE_OTHER): Payer: Medicare Other | Admitting: Physical Medicine and Rehabilitation

## 2022-05-05 VITALS — BP 117/75 | HR 91

## 2022-05-05 DIAGNOSIS — M5431 Sciatica, right side: Secondary | ICD-10-CM

## 2022-05-05 DIAGNOSIS — G8929 Other chronic pain: Secondary | ICD-10-CM

## 2022-05-05 DIAGNOSIS — M5459 Other low back pain: Secondary | ICD-10-CM

## 2022-05-05 DIAGNOSIS — M47816 Spondylosis without myelopathy or radiculopathy, lumbar region: Secondary | ICD-10-CM | POA: Diagnosis not present

## 2022-05-05 DIAGNOSIS — M5416 Radiculopathy, lumbar region: Secondary | ICD-10-CM

## 2022-05-05 DIAGNOSIS — M5441 Lumbago with sciatica, right side: Secondary | ICD-10-CM

## 2022-05-05 DIAGNOSIS — M6281 Muscle weakness (generalized): Secondary | ICD-10-CM

## 2022-05-05 DIAGNOSIS — G20 Parkinson's disease: Secondary | ICD-10-CM

## 2022-05-05 MED ORDER — TRAMADOL HCL 50 MG PO TABS: 50.0000 mg | ORAL_TABLET | Freq: Three times a day (TID) | ORAL | 0 refills | Status: DC | PRN

## 2022-05-05 MED ORDER — DIAZEPAM 5 MG PO TABS: ORAL_TABLET | ORAL | 0 refills | Status: DC

## 2022-05-05 NOTE — Progress Notes (Unsigned)
Pt state lower back pain that travels down his right hip and leg. Pt state walking and laying down makes the pain worse. Pt state he takes pain meds to help ease his pain. Pt state he gets cramps in his legs. Pt state hx of inj on 04/12/22 pt state it didn't help.  Numeric Pain Rating Scale and Functional Assessment Average Pain 6 Pain Right Now 4 My pain is constant, sharp, burning, dull, stabbing, tingling, and aching Pain is worse with: walking, bending, sitting, standing, some activites, and laying down Pain improves with: heat/ice, medication, and injections5   In the last MONTH (on 0-10 scale) has pain interfered with the following?  1. General activity like being  able to carry out your everyday physical activities such as walking, climbing stairs, carrying groceries, or moving a chair?  Rating(5)  2. Relation with others like being able to carry out your usual social activities and roles such as  activities at home, at work and in your community. Rating(6)  3. Enjoyment of life such that you have  been bothered by emotional problems such as feeling anxious, depressed or irritable?  Rating(7)

## 2022-05-05 NOTE — Progress Notes (Unsigned)
Bryan Sosa - 66 y.o. male MRN [id number]  Date of birth: 07-24-1956  Office Visit Note: Visit Date: 05/05/2022 PCP: Maury Dus, MD Referred by: Maury Dus, MD  Subjective: Chief Complaint  Patient presents with   Lower Back - Pain   Right Leg - Pain   Right Hip - Pain   HPI: Bryan Sosa is a 66 y.o. male who comes in today for evaluation of chronic, worsening and severe right sided lower back pain radiating to buttock and down right lateral leg. Pain ongoing for several weeks and is exacerbated by movement and activity. States difficulty sleeping due to severe pain. He describes pain as sore, aching and cramping sensation, currently rates as 8 out of 10. He reports some relief with home exercise regimen, rest and use of medications. He is currently attending formal physical therapy at [facility], no relief with these treatments. Recent lumbar x-ray imaging exhibits multilevel degenerative disc height loss and facet arthropathy/biforaminal narrowing at L5-S1. No listhesis noted. No history of lumbar MRI imaging. Patient underwent right L5-S1 interlaminar epidural steroid injection in our office on 04/12/2022, he reports no relief of pain with these symptoms. Patient does have history of Parkinson's Disease and is currently being managed by Dr. Metta Clines at [facility]. He is currently using crutches to assist with ambulation. Patient denies focal weakness, numbness and tingling. Patient denies recent trauma or falls.      Review of Systems  Musculoskeletal:  Positive for back pain.  Neurological:  Negative for tingling, sensory change, focal weakness and weakness.  All other systems reviewed and are negative.  Otherwise per HPI.  Assessment & Plan: Visit Diagnoses:    ICD-10-CM   1. Chronic right-sided low back pain with right-sided sciatica  M54.41 MR LUMBAR SPINE WO CONTRAST   G89.29     2. Lumbar radiculopathy  M54.16 MR LUMBAR  SPINE WO CONTRAST    3. Facet arthropathy, lumbar  M47.816 MR LUMBAR SPINE WO CONTRAST    4. Parkinson's disease ([place])  G20 MR LUMBAR SPINE WO CONTRAST       Plan: Findings:  Chronic, worsening and severe right sided lower back pain radiating to buttock and down right lateral leg. Patient continues to have severe pain despite good conservative therapies such as formal physical therapy, home exercise regimen, rest and use of medications. Patients clinical presentation and exam are consistent with L5 nerve pattern. No relief with recent right L5-S1 interlaminar epidural steroid injection. Next step is to obtain lumbar MRI imaging to access for possible stenosis. I will have patient follow up after lumbar MRI imaging to review and discuss treatment options. Patient voiced concerns with anxiety related to imaging procedure, I did prescribe pre-procedure Valium today. I also spoke with patient about medication management, I did prescribe short course of Tramadol today. No red flag symptoms noted upon exam today.   Of note I did prescribe previous prescription of Tramadol on 04/04/22, this medication is present on current PDMP, however patient states he was unable to pick up this medication due to bad weather.      Meds & Orders:  Meds ordered this encounter  Medications   traMADol (ULTRAM) 50 MG tablet    Sig: Take 1 tablet (50 mg total) by mouth every 8 (eight) hours as needed.    Dispense:  20 tablet    Refill:  0   diazepam (VALIUM) 5 MG tablet    Sig: Take one tablet by  mouth with food one hour prior to procedure. May repeat 30 minutes prior if needed.    Dispense:  2 tablet    Refill:  0    Orders Placed This Encounter  Procedures   MR LUMBAR SPINE WO CONTRAST    Follow-up: Return for follow up for lumbar MRI review.   Procedures: No procedures performed      Clinical History: No specialty comments available.   He reports that he has never smoked. He has never used smokeless  tobacco. No results for input(s): "HGBA1C", "LABURIC" in the last 8760 hours.  Objective:  VS:  HT:    WT:   BMI:     BP:117/75  HR:91bpm  TEMP: ( )  RESP:  Physical Exam Vitals and nursing note reviewed.  HENT:     Head: Normocephalic and atraumatic.     Right Ear: External ear normal.     Left Ear: External ear normal.     Nose: Nose normal.     Mouth/Throat:     Mouth: Mucous membranes are moist.  Eyes:     Extraocular Movements: Extraocular movements intact.  Cardiovascular:     Rate and Rhythm: Normal rate.     Pulses: Normal pulses.  Pulmonary:     Effort: Pulmonary effort is normal.  Abdominal:     General: Abdomen is flat.  Musculoskeletal:        General: Tenderness present.     Cervical back: Normal range of motion.     Comments: Pt rises from seated position to standing without difficulty. Good lumbar range of motion. Strong distal strength without clonus, no pain upon palpation of greater trochanters. Dysesthesias noted to right L5 dermatome. Sensation intact bilaterally. Ambulates with crutches, gait unsteady.   Skin:    General: Skin is warm and dry.     Capillary Refill: Capillary refill takes less than 2 seconds.  Neurological:     Mental Status: He is alert and oriented to person, place, and time.     Gait: Gait abnormal.  Psychiatric:        Mood and Affect: Mood normal.        Behavior: Behavior normal.     Ortho Exam  Imaging: No results found.  Past Medical/Family/Surgical/Social History: Medications & Allergies reviewed per EMR, new medications updated. Patient Active Problem List   Diagnosis Date Noted   BPH (benign prostatic hyperplasia) 10/13/2021   Mixed conductive and sensorineural hearing loss of left ear with restricted hearing of right ear 12/17/2020   OCD (obsessive compulsive disorder) 08/08/2018   Social anxiety disorder 08/08/2018   Insomnia 08/08/2018   Pain in the chest 05/07/2015   Obesity 05/07/2015   Family history of  early CAD 05/07/2015   Essential hypertension 05/07/2015   Past Medical History:  Diagnosis Date   Anxiety    Depression    ED (erectile dysfunction)    GERD (gastroesophageal reflux disease)    Headache    migraines   History of kidney stones    Hypertension    Insomnia    PVC (premature ventricular contraction)    Family History  Problem Relation Age of Onset   Dementia Mother    Ulcers Mother    Heart attack Father    Dementia Maternal Aunt    Dementia Paternal Uncle    Past Surgical History:  Procedure Laterality Date   bladder neck obstruction     2008   XI ROBOTIC ASSISTED SIMPLE PROSTATECTOMY N/A 10/13/2021  Procedure: XI ROBOTIC ASSISTED SIMPLE PROSTATECTOMY;  Surgeon: Alexis Frock, MD;  Location: [facility];  Service: Urology;  Laterality: N/A;   Social History   Occupational History   Not on file  Tobacco Use   Smoking status: Never   Smokeless tobacco: Never  Vaping Use   Vaping Use: Never used  Substance and Sexual Activity   Alcohol use: No   Drug use: No   Sexual activity: Not Currently

## 2022-05-09 ENCOUNTER — Other Ambulatory Visit: Payer: Self-pay | Admitting: Physical Medicine and Rehabilitation

## 2022-05-10 ENCOUNTER — Other Ambulatory Visit: Payer: Self-pay | Admitting: Neurology

## 2022-05-10 ENCOUNTER — Ambulatory Visit: Payer: Medicare Other

## 2022-05-10 DIAGNOSIS — M5431 Sciatica, right side: Secondary | ICD-10-CM | POA: Diagnosis not present

## 2022-05-10 DIAGNOSIS — M5459 Other low back pain: Secondary | ICD-10-CM

## 2022-05-10 DIAGNOSIS — M6281 Muscle weakness (generalized): Secondary | ICD-10-CM

## 2022-05-10 DIAGNOSIS — G20 Parkinson's disease: Secondary | ICD-10-CM

## 2022-05-10 NOTE — Therapy (Signed)
OUTPATIENT PHYSICAL THERAPY TREATMENT NOTE/DC SUMMARY   Patient Name: Bryan Sosa MRN: [id number] DOB:[birth year], 66 y.o., male Today's Date: 05/10/2022  PCP: Maury Dus, MD REFERRING PROVIDER: Almedia Balls, NP PHYSICAL THERAPY DISCHARGE SUMMARY  Visits from Start of Care: 8  Current functional level related to goals / functional outcomes: Goals partially met   Remaining deficits: pain   Education / Equipment: HEP   Patient agrees to discharge. Patient goals were partially met. Patient is being discharged due to did not respond to therapy.   END OF SESSION:   PT End of Session - 05/10/22 1405     Visit Number 8    Number of Visits 8    Date for PT Re-Evaluation 06/08/22    Authorization Type [employer]    PT Start Time 1400    PT Stop Time 1440    PT Time Calculation (min) 40 min    Activity Tolerance Patient tolerated treatment well;Patient limited by pain    Behavior During Therapy WFL for tasks assessed/performed             Past Medical History:  Diagnosis Date   Anxiety    Depression    ED (erectile dysfunction)    GERD (gastroesophageal reflux disease)    Headache    migraines   History of kidney stones    Hypertension    Insomnia    PVC (premature ventricular contraction)    Past Surgical History:  Procedure Laterality Date   bladder neck obstruction     2008   XI ROBOTIC ASSISTED SIMPLE PROSTATECTOMY N/A 10/13/2021   Procedure: XI ROBOTIC ASSISTED SIMPLE PROSTATECTOMY;  Surgeon: Alexis Frock, MD;  Location: [facility];  Service: Urology;  Laterality: N/A;   Patient Active Problem List   Diagnosis Date Noted   BPH (benign prostatic hyperplasia) 10/13/2021   Mixed conductive and sensorineural hearing loss of left ear with restricted hearing of right ear 12/17/2020   OCD (obsessive compulsive disorder) 08/08/2018   Social anxiety disorder 08/08/2018   Insomnia 08/08/2018   Pain in the chest 05/07/2015   Obesity 05/07/2015   Family history of  early CAD 05/07/2015   Essential hypertension 05/07/2015    REFERRING DIAG: M54.16 (ICD-10-CM) - Lumbar radiculopathy M54.41,G89.29 (ICD-10-CM) - Chronic right-sided low back pain with right-sided sciatica M47.816 (ICD-10-CM) - Facet arthropathy, lumbar   THERAPY DIAG:  Sciatica, right side  Other low back pain  Muscle weakness (generalized)  Parkinson's disease ([place])  Rationale for Evaluation and Treatment Rehabilitation  PERTINENT HISTORY: HPI: Bryan Sosa is a 66 y.o. male who comes in today per the request of Almedia Balls, NP for evaluation of chronic, worsening and severe right sided lower back pain radiating to buttock, hip and down right lateral leg. Daughter is accompanying him during our visit today. Pain ongoing for several weeks, worsened after exercising on rowing machine in gym. Patient states his pain has gradually increased over the last several week, states he is now using crutches to ambulate due to severe pain. He describes pain as a sore, aching and burning sensation, currently rates as 8 out of 10. Patient reports some relief of pain with home exercise regimen, rest and use of medications. Some relief of pain with recent Flexeril, Meloxicam and Prednisone taper. No history of lumbar injections/lumbar surgery. Patient does have history of Parkinson's Disease and is currently being managed by Dr. Metta Clines at [facility]. Patient denies focal weakness, numbness and tingling. Patient denies recent trauma or falls.  PRECAUTIONS: Back  SUBJECTIVE: Continued 6/10 pain level on a bad day.  Still unable to ambulate w/o crutches  PAIN:  Are you having pain? Yes: NPRS scale: 2/10 (10/10 when trying to sleep) Pain location: R buttock Pain description: ache, cramp Aggravating factors: prolonged sitting and pressure on R buttock, walking w/o crutches Relieving factors: position changes and meds   OBJECTIVE: (objective measures completed at initial evaluation unless  otherwise dated)   DIAGNOSTIC FINDINGS:  Radiographs exhibit leftward curvature with no listhesis, multilevel  degenerative disc height loss, facet arthropathy and biforaminal narrowing  at L5-S1. No fractures or dislocations noted.   PATIENT SURVEYS:  FOTO 37(55 predicted)   SCREENING FOR RED FLAGS: Bowel or bladder incontinence: No     COGNITION:           Overall cognitive status: Within functional limits for tasks assessed                          SENSATION: Not tested   MUSCLE LENGTH: Hamstrings: Right 50 deg; Left 70 deg; 05/10/22 70d B Thomas test: Right positive for paresthesias in ankle R   POSTURE:  not tested    PALPATION: TTP R piriformis    LUMBAR ROM: Deferred due to pain   Active  A/PROM  eval  Flexion    Extension    Right lateral flexion    Left lateral flexion    Right rotation    Left rotation     (Blank rows = not tested)   LOWER EXTREMITY ROM:   PROM WFL in B hips   Passive  Right eval Left eval  Hip flexion      Hip extension      Hip abduction      Hip adduction      Hip internal rotation      Hip external rotation      Knee flexion      Knee extension      Ankle dorsiflexion      Ankle plantarflexion      Ankle inversion      Ankle eversion       (Blank rows = not tested)   LOWER EXTREMITY MMT:     MMT Right eval Left eval  Hip flexion 4 4  Hip extension 4 4  Hip abduction      Hip adduction      Hip internal rotation      Hip external rotation      Knee flexion      Knee extension 4 4  Ankle dorsiflexion      Ankle plantarflexion 4 4  Ankle inversion      Ankle eversion       (Blank rows = not tested)   LUMBAR SPECIAL TESTS:  Straight leg raise test: Positive, Slump test: Negative, FABER test: Negative, and Thomas test: Positive 05/10/22 R slump test negative, R SLR negative, R hip labral test ?, R FADIR positive    FUNCTIONAL TESTS:  30s stand test 3 reps; 05/10/22 5 reps   GAIT: Distance walked:  24fx2 Assistive device utilized: Crutches Level of assistance: Modified independence Comments: antalgic gait       TODAY'S TREATMENT  [facility] Adult PT Treatment:  DATE: 05/10/22 Therapeutic Exercise: 30s STS 5 reps arms crossed Re-assessment(see goal section)  [facility] Adult PT Treatment:                                                DATE: 05/05/2022 Therapeutic Exercise: Nustep level 5 x 6 mins Seated marching 2x1' Seated hamstring stretch 2x30" Rt STS with OH reach 2x5 Seated sciatic nerve glide x15 Rt Blue pball roll outs forward/lateral x10 each Seated figure 4 push/pull stretch 2x30" each BIL Standing heel raise 1x10, 1x5 with UE support (painful)  [facility] Adult PT Treatment:                                                DATE: 05/03/2022 Therapeutic Exercise: Nustep level 5 x 5 mins Seated marching 2x1' Seated hamstring stretch 2x30" Rt STS with OH reach 2x5 Seated sciatic nerve glide x15 Rt Blue pball roll outs forward/lateral x10 each Seated figure 4 push/pull stretch 2x30" each BIL  [facility] Adult PT Treatment:                                                DATE: 04/28/2022 Therapeutic Exercise: Nustep level 5 x 5 mins Omega knee extension 10# 2x10 BIL, 5# Rt only 2x10 Omega knee flexion 30# 2x10  Seated marching 2x1' Seated hamstring stretch 2x30" Rt STS with OH reach 2x5 Seated figure 4 stretch 2x30" BIL SLR x5 Lt (terminated early due to increase in R hip pain) Supine ITB stretch with strap 2x30" Seated sciatic nerve glide x10 Rt   PATIENT EDUCATION:  Education details: Discussed eval findings, rehab rationale and POC and patient is in agreement  Person educated: Patient Education method: Explanation and Handouts Education comprehension: verbalized understanding and needs further education     HOME EXERCISE PROGRAM: Access Code: [email or web address] URL: [email or web address] Date: 04/13/2022 Prepared by: Sharlynn Oliphant   Exercises - Clamshell  - 2 x daily - 7 x weekly - 2 sets - 15 reps - Supine Figure 4 Piriformis Stretch  - 2 x daily - 7 x weekly - 1 sets - 3 reps - 30s hold Added 04/20/22 - Sit to Stand Without Arm Support  - 2 x daily - 7 x weekly - 2 sets - 10 reps - Supine Active Straight Leg Raise  - 2 x daily - 7 x weekly - 2 sets - 10 reps - Seated Sciatic Tensioner  - 2 x daily - 7 x weekly - 2 sets - 10 reps   ASSESSMENT:   CLINICAL IMPRESSION: Pain intensity has decreased to 6/10 at worst.  Symptoms appear more isolated to R hip joint with pain reproduced with FABER and FADIR positions, unable to tolerate hip flexor stretch due to anterolateral deep hip pain, R piriformis muscle remains TTP with palpable TP identified.  OBJECTIVE IMPAIRMENTS Abnormal gait, decreased activity tolerance, decreased knowledge of condition, decreased mobility, difficulty walking, decreased ROM, decreased strength, increased muscle spasms, impaired flexibility, and pain.    ACTIVITY LIMITATIONS bending, sitting, standing, sleeping, stairs, and locomotion level   PERSONAL FACTORS 1 comorbidity: PD  are  also affecting patient's functional outcome.    REHAB POTENTIAL: Good   CLINICAL DECISION MAKING: Evolving/moderate complexity   EVALUATION COMPLEXITY: Moderate     GOALS: Goals reviewed with patient? No   SHORT TERM GOALS: Target date: 04/27/2022   Patient to demonstrate independence in HEP Baseline:[email or web address] Goal status: MET Pt reports adherence 04/23/22   2.  Assess lumbar mobility and set appropriate goal Baseline: TBD; UTA due to inability to stand w/o crutches Goal status: Not met   3.  Patient to ambulate w/o need of crutches due to pain Baseline: Need of B crutches due to pain Goal status: Ongoing 04/28/22: Patient still ambulating with BIL crutches       LONG TERM GOALS: Target date: 05/11/2022   4/10 worst pain Baseline: 10/10 worst pain; 9/12 23 6/10 worst pain Goal status: Not  met   2.  Increase FOTO score to 55 Baseline: 37; 05/10/22 42 Goal status: Not met   3.  Increase 30s stand test to 5 or greater arms crossed from high table Baseline: 3 stands; 05/10/22 5 stands with arms crossed Goal status: Met   4.  Increase BLE strength to 4+/5 in B hips/knees/ankles  Baseline: 4/5 B hip/knee/ankle strength; 05/10/22 unchanged due to pain levels Goal status: Not met       PLAN: PT FREQUENCY: 2x/week   PT DURATION: 4 weeks   PLANNED INTERVENTIONS: Therapeutic exercises, Therapeutic activity, Neuromuscular re-education, Balance training, Gait training, Patient/Family education, Self Care, Joint mobilization, Stair training, DME instructions, Dry Needling, Manual therapy, and Re-evaluation.   PLAN FOR NEXT SESSION: DC to MD care for further work up to identify pathology    Lanice Shirts, PT 05/10/2022, 2:06 PM

## 2022-05-11 ENCOUNTER — Telehealth: Payer: Self-pay | Admitting: Physical Medicine and Rehabilitation

## 2022-05-11 NOTE — Telephone Encounter (Signed)
Patient called in stating he would like a handicap placard please advise

## 2022-05-12 ENCOUNTER — Ambulatory Visit: Payer: Medicare Other

## 2022-05-12 NOTE — Telephone Encounter (Signed)
Advised patient and he is hopeful he will not need permanent. We will do placard for temporary purposes and he will pick up at front desk tomorrow a.m.

## 2022-05-14 ENCOUNTER — Ambulatory Visit
Admission: RE | Admit: 2022-05-14 | Discharge: 2022-05-14 | Disposition: A | Discharge status: Home / Self Care | Payer: Medicare Other | Source: Ambulatory Visit | Attending: Physical Medicine and Rehabilitation | Admitting: Physical Medicine and Rehabilitation

## 2022-05-17 ENCOUNTER — Telehealth: Payer: Self-pay | Admitting: Physical Medicine and Rehabilitation

## 2022-05-17 ENCOUNTER — Other Ambulatory Visit: Payer: Self-pay | Admitting: Physical Medicine and Rehabilitation

## 2022-05-17 DIAGNOSIS — M5416 Radiculopathy, lumbar region: Secondary | ICD-10-CM

## 2022-05-17 NOTE — Telephone Encounter (Signed)
Patient called stating that he had just missed a call, was wanting to know if his MRI results had come back yet. CB # [phone]

## 2022-05-17 NOTE — Telephone Encounter (Signed)
Spoke with patients wife, discussed new lumbar MRI imaging with her, there is right lateral recess disc protrusion at L4-L5 impinging on the right L5 nerve root. We placed order for right L5 transforaminal epidural steroid injection.

## 2022-05-21 ENCOUNTER — Other Ambulatory Visit: Payer: Medicare Other

## 2022-05-30 NOTE — Progress Notes (Unsigned)
NEUROLOGY FOLLOW UP OFFICE NOTE  Bryan Sosa [id number]  Assessment/Plan:   Parkinson's disease     Increase carbidopa-levodopa 5/'100mg'$  to 1.5 tablets three times daily ASA '81mg'$  daily Encourage routine exercise Provided information about gym classes and programs focused on PD patients.  Follow up 6 months.       Subjective:  Bryan Sosa is a 66 year old left handed male with HTN and migraines who follows up for Parkinson's disease.  He is accompanied by his wife who also supplements history.   UPDATE: Current medication:  carbidopa levodopa 25/'100mg'$  three times daily (7:30-8 AM, 5:30 PM, 11PM)  He really hasn't noticed any change.  The levodopa has not helped with the tremor for sure.  Currently on crutches due to lumbar radiculopathy - pain in the   HISTORY: In late 2022, he started noticing tremor in his left hand.  It is difficult when he is brushing his teeth or holding utensils.  He started having slurred speech and difficulty speaking as well.  Some mild trouble swallowing.  He moves slower.  It takes a little more time to stand and straighten himself out of a chair.  However he denies freezing or gait instability.  No double vision or weakness.   He also reports short term memory problems.  It takes longer to collect his thoughts on what to say.  MRI of brain without contrast on 11/13/2021 showed nonspecific bilateral periventricular white matter T2/FLAIR foci increased compared to prior MRI from 2012.  Differential diagnosis on radiology report raised concern for demyelinating disease.  He reports that he has had poor sense of smell for many years.  Also, he has a history of having vivid dreams and sometimes acting out in his sleep.  It was noticeable after starting new medications, such as blood pressure medication, but not so much now.  He also endorses erectile dysfunction.  There is a family history of dementia in both parents and his uncle had Lewy Body Dementia.      09/23/2010 MRI BRAIN WO:  1.  Periventricular white matter changes.  These are nonspecific, but can be seen and myelination process such as multiple sclerosis.  Microvascular ischemic changes can give a similar appearance. Similar findings can be seen in the setting of chronic migraine headaches or with a vasculitis. 2.  No acute intracranial abnormality.  11/13/2021 MRI BRAIN WO:  1. Foci of FLAIR signal abnormality in the periventricular white matter bilaterally are increased in extent since 2012. These lesions are nonspecific, but the configuration raises suspicion for demyelination specifically multiple sclerosis. Differential includes sequela of chronic small vessel ischemia, vasculitis, among other etiologies.  2. Otherwise, no evidence of acute intracranial pathology.  PAST MEDICAL HISTORY: Past Medical History:  Diagnosis Date   Anxiety    Depression    ED (erectile dysfunction)    GERD (gastroesophageal reflux disease)    Headache    migraines   History of kidney stones    Hypertension    Insomnia    PVC (premature ventricular contraction)     MEDICATIONS: Current Outpatient Medications on File Prior to Visit  Medication Sig Dispense Refill   alfuzosin (UROXATRAL) 10 MG 24 hr tablet Take 10 mg by mouth daily with breakfast.     ALPRAZolam (XANAX) 0.5 MG tablet TAKE 1/2 TABLET BY MOUTH 3 TIMES DAILY AND 1 AT BEDTIME (Patient taking differently: 1 IN AM AND 1 AT BEDTIME) 75 tablet 2   amLODipine (NORVASC) 5 MG tablet  Take 5 mg by mouth daily.     aspirin-acetaminophen-caffeine (EXCEDRIN MIGRAINE) 250-250-65 MG tablet 2 tablets as needed for pain     baclofen (LIORESAL) 10 MG tablet Take 1 tablet (10 mg total) by mouth 3 (three) times daily. 30 each 0   carbidopa-levodopa (SINEMET IR) 25-100 MG tablet TAKE 1 TABLET BY MOUTH 3 TIMES DAILY 90 tablet 0   cyclobenzaprine (FLEXERIL) 10 MG tablet Take 10 mg by mouth every 8 (eight) hours as needed.     diazepam (VALIUM) 5 MG tablet  Take one tablet by mouth with food one hour prior to procedure. May repeat 30 minutes prior if needed. 2 tablet 0   DULoxetine (CYMBALTA) 60 MG capsule Take 1 capsule (60 mg total) by mouth daily. 90 capsule 3   finasteride (PROSCAR) 5 MG tablet Take 5 mg by mouth daily.     irbesartan (AVAPRO) 300 MG tablet Take 300 mg by mouth daily.     meloxicam (MOBIC) 15 MG tablet Take 15 mg by mouth daily.     metoprolol tartrate (LOPRESSOR) 50 MG tablet Take 50 mg by mouth in the morning, at noon, and at bedtime. Morning evening   7 pm and 8 pm  and bedtime  11pm     tadalafil (CIALIS) 5 MG tablet Take 5 mg by mouth daily as needed for erectile dysfunction.     traMADol (ULTRAM) 50 MG tablet Take 1 tablet (50 mg total) by mouth every 8 (eight) hours as needed. 20 tablet 0   traZODone (DESYREL) 50 MG tablet Take 1 tablet (50 mg total) by mouth at bedtime. 30 tablet 1   No current facility-administered medications on file prior to visit.    ALLERGIES: Allergies  Allergen Reactions   Sulfa Antibiotics Hives    FAMILY HISTORY: Family History  Problem Relation Age of Onset   Dementia Mother    Ulcers Mother    Heart attack Father    Dementia Maternal Aunt    Dementia Paternal Uncle       Objective:  Blood pressure 137/84, pulse 75, height 6' (1.829 m), weight 244 lb 6.4 oz (110.9 kg), SpO2 96 %. General: No acute distress.  Patient appears well-groomed.   Head:  Normocephalic/atraumatic Eyes:  Fundi examined but not visualized Neck: supple, no paraspinal tenderness, full range of motion Heart:  Regular rate and rhythm Lungs:  Clear to auscultation bilaterally Back: No paraspinal tenderness Neurological Exam: alert and oriented to person, place, and time.  Speech fluent and not dysarthric, language intact.  Hypomimia.  Hypophonic.  Otherwise, CN II-XII intact. Slight increased tone in left wrist, muscle strength 5/5 throughout.  Reduced finger-thumb tapping speed and amplitude.  Resting  tremor.  Sensation to light touch intact.  Deep tendon reflexes 2+ throughout, toes downgoing.  Finger to nose testing intact.  Gait not tested as he is suffering from pain due to sciatica.     Metta Clines, DO  CC: Maury Dus, MD

## 2022-06-01 ENCOUNTER — Encounter: Payer: Self-pay | Admitting: Neurology

## 2022-06-01 ENCOUNTER — Other Ambulatory Visit: Payer: Self-pay | Admitting: Psychiatry

## 2022-06-01 ENCOUNTER — Ambulatory Visit (INDEPENDENT_AMBULATORY_CARE_PROVIDER_SITE_OTHER): Payer: Medicare Other | Admitting: Neurology

## 2022-06-01 VITALS — BP 137/84 | HR 75 | Ht 72.0 in | Wt 244.4 lb

## 2022-06-01 DIAGNOSIS — G20A1 Parkinson's disease without dyskinesia, without mention of fluctuations: Secondary | ICD-10-CM

## 2022-06-01 DIAGNOSIS — F5105 Insomnia due to other mental disorder: Secondary | ICD-10-CM

## 2022-06-01 MED ORDER — CARBIDOPA-LEVODOPA 25-100 MG PO TABS: 1.5000 | ORAL_TABLET | Freq: Three times a day (TID) | ORAL | 5 refills | Status: DC

## 2022-06-01 NOTE — Patient Instructions (Signed)
Increase carbidopa-levodopa to 1.5 tablets three times daily

## 2022-06-02 ENCOUNTER — Ambulatory Visit: Payer: Self-pay

## 2022-06-02 ENCOUNTER — Ambulatory Visit (INDEPENDENT_AMBULATORY_CARE_PROVIDER_SITE_OTHER): Payer: Medicare Other | Admitting: Physical Medicine and Rehabilitation

## 2022-06-02 DIAGNOSIS — M5416 Radiculopathy, lumbar region: Secondary | ICD-10-CM | POA: Diagnosis not present

## 2022-06-02 MED ORDER — METHYLPREDNISOLONE ACETATE 80 MG/ML IJ SUSP
40.0000 mg | Freq: Once | INTRAMUSCULAR | Status: AC
  Administered 2022-06-02: 40 mg

## 2022-06-02 NOTE — Progress Notes (Signed)
Right L5 transforaminal epidural steroid injection planned today.. Patient states he is having increased pain and getting worse. Complains of right buttock pain with right radicular leg pain and no relief from prior injection.

## 2022-06-02 NOTE — Patient Instructions (Signed)

## 2022-06-13 NOTE — Procedures (Signed)
Lumbosacral Transforaminal Epidural Steroid Injection - Sub-Pedicular Approach with Fluoroscopic Guidance  Patient: Bryan Sosa      Date of Birth: Jan 19, 1956 MRN: [id number] PCP: Maury Dus, MD      Visit Date: 06/02/2022   Universal Protocol:    Date/Time: 06/02/2022  Consent Given By: the patient  Position: PRONE  Additional Comments: Vital signs were monitored before and after the procedure. Patient was prepped and draped in the usual sterile fashion. The correct patient, procedure, and site was verified.   Injection Procedure Details:   Procedure diagnoses: Lumbar radiculopathy [M54.16]    Meds Administered:  Meds ordered this encounter  Medications   methylPREDNISolone acetate (DEPO-MEDROL) injection 40 mg    Laterality: Right  Location/Site: L5  Needle:5.0 in., 22 ga.  Short bevel or Quincke spinal needle  Needle Placement: Transforaminal  Findings:    -Comments: Excellent flow of contrast along the nerve, nerve root and into the epidural space.  Procedure Details: After squaring off the end-plates to get a true AP view, the C-arm was positioned so that an oblique view of the foramen as noted above was visualized. The target area is just inferior to the "nose of the scotty dog" or sub pedicular. The soft tissues overlying this structure were infiltrated with 2-3 ml. of 1% Lidocaine without Epinephrine.  The spinal needle was inserted toward the target using a "trajectory" view along the fluoroscope beam.  Under AP and lateral visualization, the needle was advanced so it did not puncture dura and was located close the 6 O'Clock position of the pedical in AP tracterory. Biplanar projections were used to confirm position. Aspiration was confirmed to be negative for CSF and/or blood. A 1-2 ml. volume of Isovue-250 was injected and flow of contrast was noted at each level. Radiographs were obtained for documentation purposes.   After attaining the desired flow of  contrast documented above, a 0.5 to 1.0 ml test dose of 0.25% Marcaine was injected into each respective transforaminal space.  The patient was observed for 90 seconds post injection.  After no sensory deficits were reported, and normal lower extremity motor function was noted,   the above injectate was administered so that equal amounts of the injectate were placed at each foramen (level) into the transforaminal epidural space.   Additional Comments:  The patient tolerated the procedure well Dressing: 2 x 2 sterile gauze and Band-Aid    Post-procedure details: Patient was observed during the procedure. Post-procedure instructions were reviewed.  Patient left the clinic in stable condition.

## 2022-06-13 NOTE — Progress Notes (Signed)
Bryan Sosa - 66 y.o. male MRN [id number]  Date of birth: July 29, 1956  Office Visit Note: Visit Date: 06/02/2022 PCP: Maury Dus, MD Referred by: Maury Dus, MD  Subjective: Chief Complaint  Patient presents with   Lower Back - Pain   HPI:  Bryan Sosa is a 66 y.o. male who comes in today at the request of Barnet Pall, FNP for planned Right L5-S1 Lumbar Transforaminal epidural steroid injection with fluoroscopic guidance.  The patient has failed conservative care including home exercise, medications, time and activity modification.  This injection will be diagnostic and hopefully therapeutic.  Please see requesting physician notes for further details and justification.   ROS Otherwise per HPI.  Assessment & Plan: Visit Diagnoses:    ICD-10-CM   1. Lumbar radiculopathy  M54.16 XR C-ARM NO REPORT    Epidural Steroid injection    methylPREDNISolone acetate (DEPO-MEDROL) injection 40 mg      Plan: No additional findings.   Meds & Orders:  Meds ordered this encounter  Medications   methylPREDNISolone acetate (DEPO-MEDROL) injection 40 mg    Orders Placed This Encounter  Procedures   XR C-ARM NO REPORT   Epidural Steroid injection    Follow-up: Return for visit to requesting provider as needed.   Procedures: No procedures performed  Lumbosacral Transforaminal Epidural Steroid Injection - Sub-Pedicular Approach with Fluoroscopic Guidance  Patient: Bryan Sosa      Date of Birth: 1956/02/01 MRN: [id number] PCP: Maury Dus, MD      Visit Date: 06/02/2022   Universal Protocol:    Date/Time: 06/02/2022  Consent Given By: the patient  Position: PRONE  Additional Comments: Vital signs were monitored before and after the procedure. Patient was prepped and draped in the usual sterile fashion. The correct patient, procedure, and site was verified.   Injection Procedure Details:   Procedure diagnoses: Lumbar radiculopathy [M54.16]    Meds  Administered:  Meds ordered this encounter  Medications   methylPREDNISolone acetate (DEPO-MEDROL) injection 40 mg    Laterality: Right  Location/Site: L5  Needle:5.0 in., 22 ga.  Short bevel or Quincke spinal needle  Needle Placement: Transforaminal  Findings:    -Comments: Excellent flow of contrast along the nerve, nerve root and into the epidural space.  Procedure Details: After squaring off the end-plates to get a true AP view, the C-arm was positioned so that an oblique view of the foramen as noted above was visualized. The target area is just inferior to the "nose of the scotty dog" or sub pedicular. The soft tissues overlying this structure were infiltrated with 2-3 ml. of 1% Lidocaine without Epinephrine.  The spinal needle was inserted toward the target using a "trajectory" view along the fluoroscope beam.  Under AP and lateral visualization, the needle was advanced so it did not puncture dura and was located close the 6 O'Clock position of the pedical in AP tracterory. Biplanar projections were used to confirm position. Aspiration was confirmed to be negative for CSF and/or blood. A 1-2 ml. volume of Isovue-250 was injected and flow of contrast was noted at each level. Radiographs were obtained for documentation purposes.   After attaining the desired flow of contrast documented above, a 0.5 to 1.0 ml test dose of 0.25% Marcaine was injected into each respective transforaminal space.  The patient was observed for 90 seconds post injection.  After no sensory deficits were reported, and normal lower extremity motor function was noted,   the above injectate was administered so  that equal amounts of the injectate were placed at each foramen (level) into the transforaminal epidural space.   Additional Comments:  The patient tolerated the procedure well Dressing: 2 x 2 sterile gauze and Band-Aid    Post-procedure details: Patient was observed during the procedure. Post-procedure  instructions were reviewed.  Patient left the clinic in stable condition.    Clinical History: No specialty comments available.     Objective:  VS:  HT:    WT:   BMI:     BP:   HR: bpm  TEMP: ( )  RESP:  Physical Exam Vitals and nursing note reviewed.  Constitutional:      General: He is not in acute distress.    Appearance: Normal appearance. He is not ill-appearing.  HENT:     Head: Normocephalic and atraumatic.     Right Ear: External ear normal.     Left Ear: External ear normal.     Nose: No congestion.  Eyes:     Extraocular Movements: Extraocular movements intact.  Cardiovascular:     Rate and Rhythm: Normal rate.     Pulses: Normal pulses.  Pulmonary:     Effort: Pulmonary effort is normal. No respiratory distress.  Abdominal:     General: There is no distension.     Palpations: Abdomen is soft.  Musculoskeletal:        General: No tenderness or signs of injury.     Cervical back: Neck supple.     Right lower leg: No edema.     Left lower leg: No edema.     Comments: Patient has good distal strength without clonus.  Skin:    Findings: No erythema or rash.  Neurological:     General: No focal deficit present.     Mental Status: He is alert and oriented to person, place, and time.     Sensory: No sensory deficit.     Motor: No weakness or abnormal muscle tone.     Coordination: Coordination normal.  Psychiatric:        Mood and Affect: Mood normal.        Behavior: Behavior normal.      Imaging: No results found.

## 2022-07-27 ENCOUNTER — Telehealth: Payer: Self-pay | Admitting: Physical Medicine and Rehabilitation

## 2022-07-27 NOTE — Telephone Encounter (Signed)
Please call patient he has questions about his back after having shots.please advise..[phone]

## 2022-07-28 ENCOUNTER — Other Ambulatory Visit: Payer: Self-pay | Admitting: Physical Medicine and Rehabilitation

## 2022-07-28 DIAGNOSIS — M5416 Radiculopathy, lumbar region: Secondary | ICD-10-CM

## 2022-07-28 NOTE — Telephone Encounter (Signed)
Patient states he is having pain in the same location. Pain is in the hip, but is not running down the leg. Patient is back to using crutches. Last injection was 06/02/22 and helped for about a month. Please advise

## 2022-07-29 ENCOUNTER — Other Ambulatory Visit: Payer: Self-pay | Admitting: Psychiatry

## 2022-07-29 NOTE — Telephone Encounter (Signed)
Filled 03/27/21 appt 10/03/22

## 2022-07-29 NOTE — Telephone Encounter (Signed)
Filled 6/23 appt 10/03/22

## 2022-08-02 ENCOUNTER — Encounter: Payer: Self-pay | Admitting: Neurology

## 2022-08-15 ENCOUNTER — Ambulatory Visit (INDEPENDENT_AMBULATORY_CARE_PROVIDER_SITE_OTHER): Payer: Medicare Other | Admitting: Physical Medicine and Rehabilitation

## 2022-08-15 ENCOUNTER — Ambulatory Visit: Payer: Self-pay

## 2022-08-15 VITALS — BP 145/81 | HR 75

## 2022-08-15 DIAGNOSIS — M5416 Radiculopathy, lumbar region: Secondary | ICD-10-CM

## 2022-08-15 MED ORDER — METHYLPREDNISOLONE ACETATE 80 MG/ML IJ SUSP
80.0000 mg | Freq: Once | INTRAMUSCULAR | Status: AC
  Administered 2022-08-15: 80 mg

## 2022-08-15 NOTE — Patient Instructions (Signed)

## 2022-08-15 NOTE — Progress Notes (Signed)
Numeric Pain Rating Scale and Functional Assessment Average Pain 3   In the last MONTH (on 0-10 scale) has pain interfered with the following?  1. General activity like being  able to carry out your everyday physical activities such as walking, climbing stairs, carrying groceries, or moving a chair?  Rating(5)   +Driver, -BT, -Dye Allergies.  Lower back pain on right side to the hip

## 2022-08-20 NOTE — Progress Notes (Signed)
Bryan Sosa - 66 y.o. male MRN [id number]  Date of birth: 11/06/1955  Office Visit Note: Visit Date: 08/15/2022 PCP: Maury Dus, MD Referred by: Maury Dus, MD  Subjective: Chief Complaint  Patient presents with   Lower Back - Pain   HPI:  Bryan Sosa is a 66 y.o. male who comes in today for planned repeat Right L5-S1  Lumbar Transforaminal epidural steroid injection with fluoroscopic guidance.  The patient has failed conservative care including home exercise, medications, time and activity modification.  This injection will be diagnostic and hopefully therapeutic.  Please see requesting physician notes for further details and justification. Patient received more than 50% pain relief from prior injection.  Overall clinically he looks much much better than when we first saw him.  He is no longer using a walker for ambulation.  He really has no pain into the lower leg although he has pain into the right hip.  Has a lot of pain going from sit to stand.  Unfortunately his symptoms could be more facet arthropathy in conjunction with some rigidity from his Parkinson's disease.  Referring: Barnet Pall, FNP   ROS Otherwise per HPI.  Assessment & Plan: Visit Diagnoses:    ICD-10-CM   1. Lumbar radiculopathy  M54.16 XR C-ARM NO REPORT    Epidural Steroid injection    methylPREDNISolone acetate (DEPO-MEDROL) injection 80 mg      Plan: No additional findings.   Meds & Orders:  Meds ordered this encounter  Medications   methylPREDNISolone acetate (DEPO-MEDROL) injection 80 mg    Orders Placed This Encounter  Procedures   XR C-ARM NO REPORT   Epidural Steroid injection    Follow-up: Return for visit to requesting provider as needed.   Procedures: No procedures performed  Lumbosacral Transforaminal Epidural Steroid Injection - Sub-Pedicular Approach with Fluoroscopic Guidance  Patient: Bryan Sosa      Date of Birth: June 24, 1956 MRN: [id number] PCP: Maury Dus,  MD      Visit Date: 08/15/2022   Universal Protocol:    Date/Time: 08/15/2022  Consent Given By: the patient  Position: PRONE  Additional Comments: Vital signs were monitored before and after the procedure. Patient was prepped and draped in the usual sterile fashion. The correct patient, procedure, and site was verified.   Injection Procedure Details:   Procedure diagnoses: Lumbar radiculopathy [M54.16]    Meds Administered:  Meds ordered this encounter  Medications   methylPREDNISolone acetate (DEPO-MEDROL) injection 80 mg    Laterality: Right  Location/Site: L5  Needle:5.0 in., 22 ga.  Short bevel or Quincke spinal needle  Needle Placement: Transforaminal  Findings:    -Comments: Excellent flow of contrast along the nerve, nerve root and into the epidural space.  Procedure Details: After squaring off the end-plates to get a true AP view, the C-arm was positioned so that an oblique view of the foramen as noted above was visualized. The target area is just inferior to the "nose of the scotty dog" or sub pedicular. The soft tissues overlying this structure were infiltrated with 2-3 ml. of 1% Lidocaine without Epinephrine.  The spinal needle was inserted toward the target using a "trajectory" view along the fluoroscope beam.  Under AP and lateral visualization, the needle was advanced so it did not puncture dura and was located close the 6 O'Clock position of the pedical in AP tracterory. Biplanar projections were used to confirm position. Aspiration was confirmed to be negative for CSF and/or blood. A 1-2 ml.  volume of Isovue-250 was injected and flow of contrast was noted at each level. Radiographs were obtained for documentation purposes.   After attaining the desired flow of contrast documented above, a 0.5 to 1.0 ml test dose of 0.25% Marcaine was injected into each respective transforaminal space.  The patient was observed for 90 seconds post injection.  After no  sensory deficits were reported, and normal lower extremity motor function was noted,   the above injectate was administered so that equal amounts of the injectate were placed at each foramen (level) into the transforaminal epidural space.   Additional Comments:  The patient tolerated the procedure well Dressing: 2 x 2 sterile gauze and Band-Aid    Post-procedure details: Patient was observed during the procedure. Post-procedure instructions were reviewed.  Patient left the clinic in stable condition.    Clinical History: No specialty comments available.     Objective:  VS:  HT:    WT:   BMI:     BP:(!) 145/81  HR:75bpm  TEMP: ( )  RESP:  Physical Exam Vitals and nursing note reviewed.  Constitutional:      General: He is not in acute distress.    Appearance: Normal appearance. He is not ill-appearing.  HENT:     Head: Normocephalic and atraumatic.     Right Ear: External ear normal.     Left Ear: External ear normal.     Nose: No congestion.  Eyes:     Extraocular Movements: Extraocular movements intact.  Cardiovascular:     Rate and Rhythm: Normal rate.     Pulses: Normal pulses.  Pulmonary:     Effort: Pulmonary effort is normal. No respiratory distress.  Abdominal:     General: There is no distension.     Palpations: Abdomen is soft.  Musculoskeletal:        General: No tenderness or signs of injury.     Cervical back: Neck supple.     Right lower leg: No edema.     Left lower leg: No edema.     Comments: Patient has good distal strength without clonus.  Skin:    Findings: No erythema or rash.  Neurological:     General: No focal deficit present.     Mental Status: He is alert and oriented to person, place, and time.     Sensory: No sensory deficit.     Motor: No weakness or abnormal muscle tone.     Coordination: Coordination normal.  Psychiatric:        Mood and Affect: Mood normal.        Behavior: Behavior normal.      Imaging: No results  found.

## 2022-08-20 NOTE — Procedures (Signed)
Lumbosacral Transforaminal Epidural Steroid Injection - Sub-Pedicular Approach with Fluoroscopic Guidance  Patient: Bryan Sosa      Date of Birth: 04-04-1956 MRN: [id number] PCP: Maury Dus, MD      Visit Date: 08/15/2022   Universal Protocol:    Date/Time: 08/15/2022  Consent Given By: the patient  Position: PRONE  Additional Comments: Vital signs were monitored before and after the procedure. Patient was prepped and draped in the usual sterile fashion. The correct patient, procedure, and site was verified.   Injection Procedure Details:   Procedure diagnoses: Lumbar radiculopathy [M54.16]    Meds Administered:  Meds ordered this encounter  Medications   methylPREDNISolone acetate (DEPO-MEDROL) injection 80 mg    Laterality: Right  Location/Site: L5  Needle:5.0 in., 22 ga.  Short bevel or Quincke spinal needle  Needle Placement: Transforaminal  Findings:    -Comments: Excellent flow of contrast along the nerve, nerve root and into the epidural space.  Procedure Details: After squaring off the end-plates to get a true AP view, the C-arm was positioned so that an oblique view of the foramen as noted above was visualized. The target area is just inferior to the "nose of the scotty dog" or sub pedicular. The soft tissues overlying this structure were infiltrated with 2-3 ml. of 1% Lidocaine without Epinephrine.  The spinal needle was inserted toward the target using a "trajectory" view along the fluoroscope beam.  Under AP and lateral visualization, the needle was advanced so it did not puncture dura and was located close the 6 O'Clock position of the pedical in AP tracterory. Biplanar projections were used to confirm position. Aspiration was confirmed to be negative for CSF and/or blood. A 1-2 ml. volume of Isovue-250 was injected and flow of contrast was noted at each level. Radiographs were obtained for documentation purposes.   After attaining the desired flow of  contrast documented above, a 0.5 to 1.0 ml test dose of 0.25% Marcaine was injected into each respective transforaminal space.  The patient was observed for 90 seconds post injection.  After no sensory deficits were reported, and normal lower extremity motor function was noted,   the above injectate was administered so that equal amounts of the injectate were placed at each foramen (level) into the transforaminal epidural space.   Additional Comments:  The patient tolerated the procedure well Dressing: 2 x 2 sterile gauze and Band-Aid    Post-procedure details: Patient was observed during the procedure. Post-procedure instructions were reviewed.  Patient left the clinic in stable condition.

## 2022-08-25 ENCOUNTER — Other Ambulatory Visit: Payer: Self-pay | Admitting: Physical Medicine and Rehabilitation

## 2022-08-26 ENCOUNTER — Other Ambulatory Visit: Payer: Self-pay | Admitting: Psychiatry

## 2022-08-26 DIAGNOSIS — F5105 Insomnia due to other mental disorder: Secondary | ICD-10-CM

## 2022-10-03 ENCOUNTER — Ambulatory Visit: Payer: Self-pay | Admitting: Psychiatry

## 2022-11-24 ENCOUNTER — Ambulatory Visit (INDEPENDENT_AMBULATORY_CARE_PROVIDER_SITE_OTHER): Payer: Medicare Other | Admitting: Psychiatry

## 2022-11-24 ENCOUNTER — Encounter: Payer: Self-pay | Admitting: Psychiatry

## 2022-11-24 DIAGNOSIS — F429 Obsessive-compulsive disorder, unspecified: Secondary | ICD-10-CM | POA: Diagnosis not present

## 2022-11-24 DIAGNOSIS — F4010 Social phobia, unspecified: Secondary | ICD-10-CM | POA: Diagnosis not present

## 2022-11-24 DIAGNOSIS — F5105 Insomnia due to other mental disorder: Secondary | ICD-10-CM | POA: Diagnosis not present

## 2022-11-24 NOTE — Progress Notes (Signed)
[name] [id number] November 26, 1955 67 y.o.  Subjective:   Patient ID:  Bryan Sosa is a 67 y.o. (DOB 06/06/1956) male.  Chief Complaint:  Chief Complaint  Patient presents with   Follow-up   Anxiety   Sleeping Problem   Medication Reaction   Memory Loss    HPI Bryan Sosa presents to the office today for follow-up of OCD.  seen in december 2020.  No meds were changed.  04/16/20 appt with following noted: Still on meds including Xanax 0.5 mg tab 1/2 tab TID and 1 tab HS, risperidone 1/2 mg HS and duloxetine 60 mg daily. About the same.  OK I guess and not worse.  Anxious from time to time. Wonders if meds cause bad dreams, but more often vivid dreams.  No problems with the meds.  Consistent with meds.  Usually sleep OK. OCD under good enough control. No longer problems with sleepiness.  Anxiety is good overall.  No sig problems with intrusive or obsessive thoughts now with the medicines.  Satisfied with meds.  Still dreams of working at [employer].  Helped to get OOB earlier with reduction Risperidone to 0.5 mg HS.  Likes having the 1 mg tablets.  Consistent with psych meds.  OCD is managed mostly.   Sporadically working at [employer].  Gained 20 # but inactive more. Reduced risperidone from 1 to 1/2 mg Hs did reduce sleepiness.   No worsening of anxiety after the change.  Still has anxiety at times, no worse nor better.  No depression.  Sleep at least 8 hours. In bed 10 + hours bc retired.   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  Has retired. Plan: No further med changes  10/15/2020 appointment with the following noted: He stopped risperidone about 03/2020 without difficulty except a little trouble with sleep awakening.   Asked Dr. Mariea Clonts about this and excessive sleepiness. Taking alprazolam 0.25 mg TID and 0.5 mg HS.Marland Kitchen They are wondering about OSA.   To bed 1230 and up 930.  Toss and turn.  Wife says he snores.  He plans to go back there if continues with this  problem. Doing fine.  Joined gym 2 hours day and 5 days per week helping mood. Not sure about depression.  Residual anxiety and would like to continue meds. No avoidance as far as he knows. Weight about 241#.  Questions about meds and weight gain. Plan: No med changes. Continue alprazolam 0.5 mg half 3 times daily and 1 nightly prn, risperidone 1 mg nightly, duloxetine 60 mg daily.  06/30/2021 appointment with the following noted: Usually alprazolam 0.25 mg am and 0.5 mg HS. Good overall.  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  Pleased. Anxiety mostly OK.  Occ bouts are less often.  Time to time obs but nowhere near as bad as it was. Noticed positional tremor resting.   No sleep study. Went off risperidone for 10 days and starting having sleep problems so restarted it.   Not depressed.  Trouble staying asleep at times.  Might nap 2 days per week for 60-90 mins and other days 2 hour naps.  Then in bed 9 hours.  Doesn't think his sleep is that good.  May go to bed out of boredom. 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 D pregnant and due Dec with first grandchild. He stopped and restarted risperidone.   Plan: He doesn't want further med changes. Continue duloxetine 60, risperidone 1, alprazolam prn.  03/30/2022  appointment noted: Sciatica on R and using crutches for 3 weeks.   Has been to doctor and again on Monday. Dx PD May 2023. Dr Loretta Plume. Started Sinemet.  Not helped so far. Reduced risperidone to 1/2 tablet at night and still on duloxetine 60 mg daily, still taking alprazolam 0.25 mg AM and 0.5 mg HS Didn't want to stop risperidone bc helps with sleep some.  Was sleeping good before risperidone. Never completely stopped it. Anxiety and OCD is no worse than last time and relatively controlled for the last couple of years. Plan: Continue duloxetine 60 DC risperidone DT dx PD and tremor For sleep trazodone 50 mg HS Ok to continue low dose alprazolam  11/24/22 appt noted: DX  PD. Stopped risperidone  and not sure about tremor and anxiety is not worse. Trying to come off Xanax.  Had read about BZ causing dementia.  Reduced to 1/2 Xanax 0.5 BID fior a few weeks and stopped 1-2 weeks ago. 2 and stopped. Don't sleep as well without it.  To bed 12 and up at [place] and reads in bed. Awakens 3 AM to urinate.   Still dealing with nausea for a week which has affected sleep.  Slept longer with Xanax. Wife concerns about his memory and sedentary life now that retired.  He is interested in things.  Forgot anniversary date.  Does not get lost driving.   Tremor in dominant hand.   Not as patient.  Anxiety through the roof driving to [place]. Psych meds: duloxetine 60 , trazodone 62 HS M dementia and uncle Lewey Body dementia. Not depressed.  No panic.   No problem with intrusive thoughts usually. ED for years. ED meds a little helpful but not much.  Tried injections which helped a while.  Past Psychiatric Medication Trials: Sertraline diarrhea, fluvoxamine sedation, fluoxetine cognitive side effects, paroxetine side effects,  risperidone 1 mg,  Xanax Duloxetine 60 mg since September 2019  Review of Systems:  Review of Systems  Constitutional:  Negative for unexpected weight change.  Cardiovascular:  Negative for palpitations.  Musculoskeletal:  Positive for back pain and gait problem.  Neurological:  Positive for tremors. Negative for weakness.  Psychiatric/Behavioral:  Positive for sleep disturbance. Negative for agitation, behavioral problems, confusion, decreased concentration, dysphoric mood, hallucinations, self-injury and suicidal ideas. The patient is not nervous/anxious and is not hyperactive.     Medications: I have reviewed the patient's current medications.  Current Outpatient Medications  Medication Sig Dispense Refill   alfuzosin (UROXATRAL) 10 MG 24 hr tablet Take 10 mg by mouth daily with breakfast.     amLODipine (NORVASC) 5 MG  tablet Take 5 mg by mouth daily.     aspirin-acetaminophen-caffeine (EXCEDRIN MIGRAINE) 250-250-65 MG tablet 2 tablets as needed for pain     atorvastatin (LIPITOR) 10 MG tablet SMARTSIG:1 Tablet(s) By Mouth Every Evening     baclofen (LIORESAL) 10 MG tablet Take 1 tablet (10 mg total) by mouth 3 (three) times daily. 30 each 0   carbidopa-levodopa (SINEMET) 25-100 MG tablet Take 1.5 tablets by mouth 3 (three) times daily. 135 tablet 5   DULoxetine (CYMBALTA) 60 MG capsule Take 1 capsule (60 mg total) by mouth daily. 90 capsule 3   finasteride (PROSCAR) 5 MG tablet Take 5 mg by mouth daily.     irbesartan (AVAPRO) 300 MG tablet Take 300 mg by mouth daily.     meloxicam (MOBIC) 15 MG tablet Take 15 mg by mouth daily.  metoprolol tartrate (LOPRESSOR) 50 MG tablet Take 50 mg by mouth 3 (three) times daily.     risperiDONE (RISPERDAL) 1 MG tablet Take 1 mg by mouth at bedtime.     sildenafil (VIAGRA) 100 MG tablet Take 100 mg by mouth daily as needed for erectile dysfunction.     tadalafil (CIALIS) 5 MG tablet Take 5 mg by mouth daily as needed for erectile dysfunction.     traMADol (ULTRAM) 50 MG tablet Take 1 tablet (50 mg total) by mouth every 8 (eight) hours as needed. 20 tablet 0   traZODone (DESYREL) 50 MG tablet Take 1 tablet by mouth at bedtime. 30 tablet 2   ALPRAZolam (XANAX) 0.5 MG tablet TAKE 1/2 TABLET BY MOUTH 3 TIMES DAILY AND 1 AT BEDTIME (Patient not taking: Reported on 11/24/2022) 75 tablet 2   No current facility-administered medications for this visit.    Medication Side Effects: None  Allergies:  Allergies  Allergen Reactions   Sulfa Antibiotics Hives    Past Medical History:  Diagnosis Date   Anxiety    Depression    ED (erectile dysfunction)    GERD (gastroesophageal reflux disease)    Headache    migraines   History of kidney stones    Hypertension    Insomnia    PVC (premature ventricular contraction)     Family History  Problem Relation Age of Onset    Dementia Mother    Ulcers Mother    Heart attack Father    Dementia Maternal Aunt    Dementia Paternal Uncle     Social History   Socioeconomic History   Marital status: Married    Spouse name: Not on file   Number of children: Not on file   Years of education: Not on file   Highest education level: Not on file  Occupational History   Not on file  Tobacco Use   Smoking status: Never   Smokeless tobacco: Never  Vaping Use   Vaping Use: Never used  Substance and Sexual Activity   Alcohol use: No   Drug use: No   Sexual activity: Not Currently  Other Topics Concern   Not on file  Social History Narrative   Left handed   Social Determinants of Health   Financial Resource Strain: Not on file  Food Insecurity: Not on file  Transportation Needs: Not on file  Physical Activity: Not on file  Stress: Not on file  Social Connections: Not on file  Intimate Partner Violence: Not on file    Past Medical History, Surgical history, Social history, and Family history were reviewed and updated as appropriate.   Please see review of systems for further details on the patient's review from today.   Objective:   Physical Exam:  There were no vitals taken for this visit.  Physical Exam Constitutional:      General: He is not in acute distress.    Appearance: Normal appearance. He is well-developed.  Musculoskeletal:        General: No deformity.  Neurological:     Mental Status: He is alert and oriented to person, place, and time.     Motor: Tremor present.     Coordination: Coordination normal.     Gait: Gait normal.  Psychiatric:        Attention and Perception: Attention and perception normal.        Mood and Affect: Mood is anxious. Mood is not depressed. Affect is not labile, blunt, angry or  inappropriate.        Speech: Speech is not delayed.        Behavior: Behavior is not slowed.        Thought Content: Thought content normal. Thought content is not  delusional. Thought content does not include homicidal or suicidal ideation. Thought content does not include suicidal plan.        Cognition and Memory: Cognition normal.        Judgment: Judgment normal.     Comments: Insight intact. No auditory or visual hallucinations. No delusions. Chronic scrupulosity obsessions but under good control Immed mem 3/3. 93, 86, 79, 72, 65. Recall 3/3. Oriented .  Knows president.     Lab Review:     Component Value Date/Time   NA 134 (L) 11/14/2021 1455   K 3.8 11/14/2021 1455   CL 100 11/14/2021 1455   CO2 26 11/14/2021 1455   GLUCOSE 139 (H) 11/14/2021 1455   BUN 14 11/14/2021 1455   CREATININE 1.12 11/14/2021 1455   CALCIUM 8.5 (L) 11/14/2021 1455   PROT 7.2 03/08/2014 2005   ALBUMIN 3.9 03/08/2014 2005   AST 17 03/08/2014 2005   ALT 16 03/08/2014 2005   ALKPHOS 59 03/08/2014 2005   BILITOT 0.3 03/08/2014 2005   GFRNONAA >60 11/14/2021 1455   GFRAA >60 03/28/2017 0820       Component Value Date/Time   WBC 10.6 (H) 11/14/2021 1455   RBC 4.25 11/14/2021 1455   HGB 11.9 (L) 11/14/2021 1455   HCT 36.5 (L) 11/14/2021 1455   PLT 293 11/14/2021 1455   MCV 85.9 11/14/2021 1455   MCH 28.0 11/14/2021 1455   MCHC 32.6 11/14/2021 1455   RDW 13.2 11/14/2021 1455   LYMPHSABS 1.5 11/14/2021 1455   MONOABS 0.8 11/14/2021 1455   EOSABS 0.6 (H) 11/14/2021 1455   BASOSABS 0.1 11/14/2021 1455    No results found for: "POCLITH", "LITHIUM"   No results found for: "PHENYTOIN", "PHENOBARB", "VALPROATE", "CBMZ"   .res Assessment: Plan:    Bryan Sosa was seen today for follow-up, anxiety, sleeping problem, medication reaction and memory loss.  Diagnoses and all orders for this visit:  Obsessive-compulsive disorder with good or fair insight  Social anxiety disorder  Insomnia due to mental condition  Medication sensitive.  Disc usual course and response of OCD, scrupulosity with meds and unlikely to fully resolve and need for chronic  treatment.  Also usually need max dosages of SSRI but he's med sensitive. Importance of activity with retirement otherwise anxiety might be worse but he's done OK with anxiety but limited activity..  Encourage activiity .  He stays busy with volunteering at church and other places.  Asked questions about PD including risk psychosis, SE meds.  And about sx.  Answered questions about PD and mental health.  Disc importance activity to keep mental health.    Watch caffeine and sleep.  Sleep hygiene.  Spending too much time in bed. Sleep restriction.  We discussed the short-term risks associated with benzodiazepines including sedation and increased fall risk among others.  Discussed long-term side effect risk including dependence, potential withdrawal symptoms, and the potential eventual dose-related risk of dementia.  But recent studies from 2020 dispute this association between benzodiazepines and dementia risk. Newer studies in 2020 do not support an association with dementia. Ok to continue low dose alprazolam.  Continue duloxetine 60.  Fear relapse if reduce it. For sleep trazodone 50 mg HS.  Option increase it. Ok to continue low dose alprazolam  if needed  Fu 6 mos  Lynder Parents, MD, DFAPA    Please see After Visit Summary for patient specific instructions.  Future Appointments  Date Time Provider [place]  12/23/2022  1:30 PM Metta Clines R, DO [facility] None    No orders of the defined types were placed in this encounter.     -------------------------------

## 2022-11-25 ENCOUNTER — Other Ambulatory Visit: Payer: Self-pay | Admitting: Psychiatry

## 2022-11-25 DIAGNOSIS — F5105 Insomnia due to other mental disorder: Secondary | ICD-10-CM

## 2022-12-02 ENCOUNTER — Ambulatory Visit: Payer: Medicare Other | Admitting: Neurology

## 2022-12-05 ENCOUNTER — Other Ambulatory Visit: Payer: Self-pay | Admitting: Neurology

## 2022-12-21 NOTE — Progress Notes (Signed)
NEUROLOGY FOLLOW UP OFFICE NOTE  Bryan Sosa [id number]  Assessment/Plan:   Idiopathic Parkinson's disease with levodopa-resistant tremor Restless leg syndrome - the paroxsymal stinging pain in his calves may be RLS.  Not consistent with polyneuropathy or radiculopathy - does not bother him enough to warrant treatment    Will decrease C-L 25/100mg  back to 1 tablet three times daily, as there really hasn't been any benefit At this time, tremor is manageable.  Consider starting Artane or dopamine agonist in the future to treat the tremor Encourage routine exercise Follow up 6 months.       Subjective:  Bryan Sosa is a 67 year old left handed male with HTN and migraines who follows up for Parkinson's disease.  He is accompanied by his wife who also supplements history.   UPDATE: Current medication:  carbidopa levodopa 25/100mg  1.5 tablets three times daily (7:30-8 AM, 5:30 PM, 11PM)  At last visit, we increased dose of C-L to 1.5 tablets three times daily.  He hasn't noticed much difference.  He reports feeling paroxysmal shocks in his legs, typically the calf, occurring usually when laying down.  HISTORY: In late 2022, he started noticing tremor in his left hand.  It is difficult when he is brushing his teeth or holding utensils.  He started having slurred speech and difficulty speaking as well.  Some mild trouble swallowing.  He moves slower.  It takes a little more time to stand and straighten himself out of a chair.  However he denies freezing or gait instability.  No double vision or weakness.   He also reports short term memory problems.  It takes longer to collect his thoughts on what to say.  MRI of brain without contrast on 11/13/2021 showed nonspecific bilateral periventricular white matter T2/FLAIR foci increased compared to prior MRI from 2012.  Differential diagnosis on radiology report raised concern for demyelinating disease.  He reports that he has had poor sense of  smell for many years.  Also, he has a history of having vivid dreams and sometimes acting out in his sleep, suspicious for REM sleep behavior disorder.  It was noticeable after starting new medications, such as blood pressure medication, but not so much now.  He also endorses erectile dysfunction.  There is a family history of dementia in both parents and his uncle had Lewy Body Dementia.     09/23/2010 MRI BRAIN WO:  1.  Periventricular white matter changes.  These are nonspecific, but can be seen and myelination process such as multiple sclerosis.  Microvascular ischemic changes can give a similar appearance. Similar findings can be seen in the setting of chronic migraine headaches or with a vasculitis. 2.  No acute intracranial abnormality.  11/13/2021 MRI BRAIN WO:  1. Foci of FLAIR signal abnormality in the periventricular white matter bilaterally are increased in extent since 2012. These lesions are nonspecific, but the configuration raises suspicion for demyelination specifically multiple sclerosis. Differential includes sequela of chronic small vessel ischemia, vasculitis, among other etiologies.  2. Otherwise, no evidence of acute intracranial pathology.  PAST MEDICAL HISTORY: Past Medical History:  Diagnosis Date   Anxiety    Depression    ED (erectile dysfunction)    GERD (gastroesophageal reflux disease)    Headache    migraines   History of kidney stones    Hypertension    Insomnia    PVC (premature ventricular contraction)     MEDICATIONS: Current Outpatient Medications on File Prior to Visit  Medication Sig Dispense Refill   alfuzosin (UROXATRAL) 10 MG 24 hr tablet Take 10 mg by mouth daily with breakfast.     ALPRAZolam (XANAX) 0.5 MG tablet TAKE 1/2 TABLET BY MOUTH 3 TIMES DAILY AND 1 AT BEDTIME (Patient not taking: Reported on 11/24/2022) 75 tablet 2   amLODipine (NORVASC) 5 MG tablet Take 5 mg by mouth daily.     aspirin-acetaminophen-caffeine (EXCEDRIN MIGRAINE)  250-250-65 MG tablet 2 tablets as needed for pain     atorvastatin (LIPITOR) 10 MG tablet SMARTSIG:1 Tablet(s) By Mouth Every Evening     baclofen (LIORESAL) 10 MG tablet Take 1 tablet (10 mg total) by mouth 3 (three) times daily. 30 each 0   carbidopa-levodopa (SINEMET IR) 25-100 MG tablet Take 1.5 tablets by mouth 3 (three) times daily. 135 tablet 0   DULoxetine (CYMBALTA) 60 MG capsule Take 1 capsule (60 mg total) by mouth daily. 90 capsule 3   finasteride (PROSCAR) 5 MG tablet Take 5 mg by mouth daily.     irbesartan (AVAPRO) 300 MG tablet Take 300 mg by mouth daily.     meloxicam (MOBIC) 15 MG tablet Take 15 mg by mouth daily.     metoprolol tartrate (LOPRESSOR) 50 MG tablet Take 50 mg by mouth 3 (three) times daily.     risperiDONE (RISPERDAL) 1 MG tablet Take 1 mg by mouth at bedtime.     sildenafil (VIAGRA) 100 MG tablet Take 100 mg by mouth daily as needed for erectile dysfunction.     tadalafil (CIALIS) 5 MG tablet Take 5 mg by mouth daily as needed for erectile dysfunction.     traMADol (ULTRAM) 50 MG tablet Take 1 tablet (50 mg total) by mouth every 8 (eight) hours as needed. 20 tablet 0   traZODone (DESYREL) 50 MG tablet Take 1 tablet by mouth at bedtime. 30 tablet 4   No current facility-administered medications on file prior to visit.    ALLERGIES: Allergies  Allergen Reactions   Sulfa Antibiotics Hives    FAMILY HISTORY: Family History  Problem Relation Age of Onset   Dementia Mother    Ulcers Mother    Heart attack Father    Dementia Maternal Aunt    Dementia Paternal Uncle       Objective:  Blood pressure 133/79, pulse 78, height 6\' 2"  (1.88 m), weight 255 lb (115.7 kg), SpO2 96 %. General: No acute distress.  Patient appears well-groomed.   Head:  Normocephalic/atraumatic Eyes:  Fundi examined but not visualized Neck: supple, no paraspinal tenderness, full range of motion Heart:  Regular rate and rhythm Neurological Exam: Alert and oriented.  Speech fluent  and not dysarthric.  Language intact.  Hypomimia.  Hypophonic.  CN II-XII intact.  Trace increased tone in left wrist.  Muscle strength 5/5 throughout.  Reduced finger-thumb tapping speed and amplitude.  Left resting tremor.  Sensation to pinprick and vibration intact.  Deep tendon reflexes 2+ throughout.  Finger to nose testing intact.  Antalgic gait (sprained his foot) but with upright posture, slightly reduced left arm swing and no shuffling.  Romberg negative.   [name], DO  CC: [name], MD

## 2022-12-23 ENCOUNTER — Ambulatory Visit (INDEPENDENT_AMBULATORY_CARE_PROVIDER_SITE_OTHER): Payer: Medicare Other | Admitting: Neurology

## 2022-12-23 ENCOUNTER — Encounter: Payer: Self-pay | Admitting: Neurology

## 2022-12-23 VITALS — BP 133/79 | HR 78 | Ht 74.0 in | Wt 255.0 lb

## 2022-12-23 DIAGNOSIS — G2581 Restless legs syndrome: Secondary | ICD-10-CM | POA: Diagnosis not present

## 2022-12-23 DIAGNOSIS — G20A1 Parkinson's disease without dyskinesia, without mention of fluctuations: Secondary | ICD-10-CM | POA: Diagnosis not present

## 2022-12-23 MED ORDER — CARBIDOPA-LEVODOPA 25-100 MG PO TABS: 1.0000 | ORAL_TABLET | Freq: Three times a day (TID) | ORAL | 5 refills | Status: DC

## 2022-12-23 NOTE — Patient Instructions (Addendum)
Decrease carbidopa-levodopa back to 1 tablet three times daily (7:30-8 AM, 5:30 PM and 11 PM)

## 2022-12-28 ENCOUNTER — Ambulatory Visit (INDEPENDENT_AMBULATORY_CARE_PROVIDER_SITE_OTHER): Payer: Medicare Other | Admitting: Podiatry

## 2022-12-28 ENCOUNTER — Encounter: Payer: Self-pay | Admitting: Podiatry

## 2022-12-28 ENCOUNTER — Ambulatory Visit (INDEPENDENT_AMBULATORY_CARE_PROVIDER_SITE_OTHER): Payer: Medicare Other

## 2022-12-28 DIAGNOSIS — M722 Plantar fascial fibromatosis: Secondary | ICD-10-CM

## 2022-12-28 MED ORDER — TRIAMCINOLONE ACETONIDE 40 MG/ML IJ SUSP
40.0000 mg | Freq: Once | INTRAMUSCULAR | Status: AC
Start: 2022-12-28 — End: 2022-12-28
  Administered 2022-12-28: 40 mg

## 2022-12-28 MED ORDER — METHYLPREDNISOLONE 4 MG PO TBPK: ORAL_TABLET | ORAL | 0 refills | Status: DC

## 2022-12-28 NOTE — Progress Notes (Signed)
Subjective:  Patient ID: Bryan Sosa, male    DOB: 02-Oct-1955,  MRN: [id number] HPI Chief Complaint  Patient presents with   Foot Pain    Plantar heel bilateral - aching x years intermittently, worsened lately, also changing smoke detector a week ago and lost balance and come down hard onto left heel, naproxen, he has parkinson's, tried OTC insoles   New Patient (Initial Visit)    67 y.o. male presents with the above complaint.   ROS: Denies fever chills nausea vomiting muscle aches pains calf pain back pain chest pain shortness of breath.  Past Medical History:  Diagnosis Date   Anxiety    Depression    ED (erectile dysfunction)    GERD (gastroesophageal reflux disease)    Headache    migraines   History of kidney stones    Hypertension    Insomnia    PVC (premature ventricular contraction)    Past Surgical History:  Procedure Laterality Date   bladder neck obstruction     2008   XI ROBOTIC ASSISTED SIMPLE PROSTATECTOMY N/A 10/13/2021   Procedure: XI ROBOTIC ASSISTED SIMPLE PROSTATECTOMY;  Surgeon: Sebastian Ache, MD;  Location: [facility];  Service: Urology;  Laterality: N/A;    Current Outpatient Medications:    LAGEVRIO 200 MG CAPS capsule, SMARTSIG:4 Capsule(s) By Mouth Every 12 Hours, Disp: , Rfl:    methylPREDNISolone (MEDROL DOSEPAK) 4 MG TBPK tablet, 6 day dose pack - take as directed, Disp: 21 tablet, Rfl: 0   alfuzosin (UROXATRAL) 10 MG 24 hr tablet, Take 10 mg by mouth daily with breakfast., Disp: , Rfl:    amLODipine (NORVASC) 10 MG tablet, Take 10 mg by mouth daily., Disp: , Rfl:    aspirin-acetaminophen-caffeine (EXCEDRIN MIGRAINE) 250-250-65 MG tablet, 2 tablets as needed for pain, Disp: , Rfl:    atorvastatin (LIPITOR) 10 MG tablet, SMARTSIG:1 Tablet(s) By Mouth Every Evening, Disp: , Rfl:    baclofen (LIORESAL) 10 MG tablet, Take 1 tablet (10 mg total) by mouth 3 (three) times daily., Disp: 30 each, Rfl: 0   carbidopa-levodopa (SINEMET IR) 25-100 MG  tablet, Take 1 tablet by mouth 3 (three) times daily., Disp: 90 tablet, Rfl: 5   DULoxetine (CYMBALTA) 60 MG capsule, Take 1 capsule (60 mg total) by mouth daily., Disp: 90 capsule, Rfl: 3   finasteride (PROSCAR) 5 MG tablet, Take 5 mg by mouth daily., Disp: , Rfl:    irbesartan (AVAPRO) 300 MG tablet, Take 300 mg by mouth daily., Disp: , Rfl:    metoprolol tartrate (LOPRESSOR) 50 MG tablet, Take 50 mg by mouth 3 (three) times daily., Disp: , Rfl:    risperiDONE (RISPERDAL) 1 MG tablet, Take 1 mg by mouth at bedtime., Disp: , Rfl:    sildenafil (VIAGRA) 100 MG tablet, Take 100 mg by mouth daily as needed for erectile dysfunction., Disp: , Rfl:    tadalafil (CIALIS) 5 MG tablet, Take 5 mg by mouth daily as needed for erectile dysfunction., Disp: , Rfl:    traZODone (DESYREL) 50 MG tablet, Take 1 tablet by mouth at bedtime., Disp: 30 tablet, Rfl: 4  Allergies  Allergen Reactions   Sulfa Antibiotics Hives   Review of Systems Objective:  There were no vitals filed for this visit.  General: Well developed, nourished, in no acute distress, alert and oriented x3   Dermatological: Skin is warm, dry and supple bilateral. Nails x 10 are well maintained; remaining integument appears unremarkable at this time. There are no open sores, no  preulcerative lesions, no rash or signs of infection present.  Vascular: Dorsalis Pedis artery and Posterior Tibial artery pedal pulses are 2/4 bilateral with immedate capillary fill time. Pedal hair growth present. No varicosities and no lower extremity edema present bilateral.   Neruologic: Grossly intact via light touch bilateral. Vibratory intact via tuning fork bilateral. Protective threshold with Semmes Wienstein monofilament intact to all pedal sites bilateral. Patellar and Achilles deep tendon reflexes 2+ bilateral. No Babinski or clonus noted bilateral.   Musculoskeletal: No gross boney pedal deformities bilateral. No pain, crepitus, or limitation noted with  foot and ankle range of motion bilateral. Muscular strength 5/5 in all groups tested bilateral.  Pain on palpation medial calcaneal tubercles bilateral.  Gait: Unassisted, Nonantalgic.    Radiographs:  Radiographs taken today demonstrate osseously mature individual plantar distally oriented calcaneal heel spur soft tissue increase in density plantar fascial kidney insertion site no other significant osseous abnormalities acutely noted.  Assessment & Plan:   Assessment: Planter fasciitis bilateral.  Plan: Discussed etiology pathology and surgical therapies injected the bilateral heels today placed plantar fascia braces today placed him on methylprednisolone to be followed by his naproxen he has at home discussed appropriate shoe gear will follow-up with him in 1 month     Abed Schar T. [place], [place]

## 2023-01-31 ENCOUNTER — Encounter: Payer: Self-pay | Admitting: Podiatry

## 2023-01-31 ENCOUNTER — Ambulatory Visit (INDEPENDENT_AMBULATORY_CARE_PROVIDER_SITE_OTHER): Payer: Medicare Other | Admitting: Podiatry

## 2023-01-31 VITALS — BP 141/85 | HR 70

## 2023-01-31 DIAGNOSIS — M722 Plantar fascial fibromatosis: Secondary | ICD-10-CM

## 2023-01-31 MED ORDER — TRIAMCINOLONE ACETONIDE 40 MG/ML IJ SUSP
40.0000 mg | Freq: Once | INTRAMUSCULAR | Status: AC
  Administered 2023-01-31: 40 mg

## 2023-02-01 NOTE — Progress Notes (Signed)
Bryan Sosa presents today for follow-up of his Planter fasciitis bilaterally.  States the brace is really help.  However without the brace is they still hurt like crazy.  Objective: Vital signs stable he is alert oriented x 3 still has pain on palpation plantar aspect of the bilateral heel.  Most likely Planter fasciitis bilateral.  Parkinson's disease  Plan: Reinjected today encouraged him to continue to wear his appropriate shoe gear and his plantar fascia braces

## 2023-03-16 ENCOUNTER — Ambulatory Visit: Payer: Medicare Other | Admitting: Podiatry

## 2023-03-30 ENCOUNTER — Ambulatory Visit (INDEPENDENT_AMBULATORY_CARE_PROVIDER_SITE_OTHER): Payer: Medicare Other | Admitting: Podiatry

## 2023-03-30 DIAGNOSIS — S93692A Other sprain of left foot, initial encounter: Secondary | ICD-10-CM | POA: Diagnosis not present

## 2023-03-30 DIAGNOSIS — G5782 Other specified mononeuropathies of left lower limb: Secondary | ICD-10-CM

## 2023-03-30 DIAGNOSIS — G5762 Lesion of plantar nerve, left lower limb: Secondary | ICD-10-CM | POA: Diagnosis not present

## 2023-03-30 MED ORDER — TRIAMCINOLONE ACETONIDE 40 MG/ML IJ SUSP
20.0000 mg | Freq: Once | INTRAMUSCULAR | Status: AC
  Administered 2023-03-30: 20 mg

## 2023-03-30 NOTE — Progress Notes (Signed)
He presents today for follow-up of his Planter fasciitis he says the right one feels better but the last one is just not getting anywhere.  He has Parkinson disease and he needs to exercise to help maintain his strength and his balance.  He states that he is going to [employer] might of helped a little bit the pain level is generally a 4 out of 10 most days he is trying to do his stretches and trying to be active but he is not able to do so.  He is also complaining of a stricture feeling around his fourth toe on his left foot.  States that is been there now for about a month  Objective: Vital signs are stable he is alert and oriented x 3 pulses are palpable.  He has minimal to no pain on palpation medial calcaneal tubercle of the right heel however the left heel demonstrates erythema and edema exquisitely painful on palpation on dorsiflexion of the toes and palpation of the medial band of the plantar fascia is painful to him.  Same foot he has a palpable Mulder's click to the third interdigital space of the left foot consistent with neuroma.  Assessment probable tear of the plantar fascia on his left heel.  Neuroma third interdigital space left  Plan: Discussed etiology pathology conservative surgical therapies for the forefoot pain I injected 5 mg of Kenalog and local anesthetic to the neuroma site.  I am also requesting an MRI for evaluation of that plantar fascia to evaluate for surgical consideration and to evaluate for differential diagnoses.  I will follow-up with him once the results of the MRI arrive.

## 2023-04-13 ENCOUNTER — Other Ambulatory Visit: Payer: Self-pay | Admitting: Psychiatry

## 2023-04-13 DIAGNOSIS — F4010 Social phobia, unspecified: Secondary | ICD-10-CM

## 2023-04-13 DIAGNOSIS — F429 Obsessive-compulsive disorder, unspecified: Secondary | ICD-10-CM

## 2023-04-19 ENCOUNTER — Ambulatory Visit: Admission: RE | Admit: 2023-04-19 | Payer: Medicare Other | Source: Ambulatory Visit

## 2023-04-19 DIAGNOSIS — S93692A Other sprain of left foot, initial encounter: Secondary | ICD-10-CM

## 2023-05-12 ENCOUNTER — Telehealth: Payer: Self-pay | Admitting: Podiatry

## 2023-05-12 NOTE — Telephone Encounter (Signed)
Left message for pt to call to schedule an appt to discuss mri results

## 2023-05-16 ENCOUNTER — Encounter: Payer: Self-pay | Admitting: Podiatry

## 2023-05-16 ENCOUNTER — Ambulatory Visit (INDEPENDENT_AMBULATORY_CARE_PROVIDER_SITE_OTHER): Payer: Medicare Other | Admitting: Podiatry

## 2023-05-16 DIAGNOSIS — S93692D Other sprain of left foot, subsequent encounter: Secondary | ICD-10-CM

## 2023-05-17 NOTE — Progress Notes (Signed)
He presents today with his wife for follow-up of his plantar fasciitis left foot.  Objective: No change in physical exam still has a painful palpable neuroma third interdigital space left foot painful medial plantar fascial band at its insertion on the calcaneus.  MRI does demonstrate a tear in the central and lateral bands  Assessment painful plantar fasciitis left with a tear per MRI.  Plan: Discussed etiology pathology conservative surgical therapies at this point we consented him for a endoscopic plantar fasciotomy as well as PRP injection to the neuroma and to the plantar fascial site.  We discussed the possible postop complications which may include but are not limited to postop pain bleeding swell infection recurrence need for further surgery overcorrection under correction also digit loss limb loss of life.  He signed the consent pages today and he went to the scheduler's office.

## 2023-05-19 ENCOUNTER — Telehealth: Payer: Self-pay | Admitting: Podiatry

## 2023-05-19 NOTE — Telephone Encounter (Signed)
DOS- 06/02/2023  ENDOSCOPIC PLANTAR FASCIOTOMY [id number] PRP INJECTION LT-20550  [employer] EFFECTIVE DATE- 10/28/2022  DEDUCTIBLE- $200.00 WITH REMAINING $0.00 OOP- $2200.00 WITH REMAINING $2000.00 COINSURANCE - 0%  PER THE [employer] WEBSITE, PRIOR AUTHORIZATION HAS BEEN APPROVED FOR CPT CODES 16109 AND 20550. GOOD FROM 06/02/2023 - 08/31/2023  AUTH REFERENCE NUMBER: [id number]

## 2023-05-29 ENCOUNTER — Ambulatory Visit (INDEPENDENT_AMBULATORY_CARE_PROVIDER_SITE_OTHER): Payer: Self-pay | Admitting: Psychiatry

## 2023-05-29 DIAGNOSIS — Z91199 Patient's noncompliance with other medical treatment and regimen due to unspecified reason: Secondary | ICD-10-CM

## 2023-05-29 NOTE — Progress Notes (Signed)
No show

## 2023-06-08 ENCOUNTER — Other Ambulatory Visit: Payer: Self-pay | Admitting: Podiatry

## 2023-06-08 ENCOUNTER — Encounter: Payer: Medicare Other | Admitting: Podiatry

## 2023-06-08 MED ORDER — ONDANSETRON HCL 4 MG PO TABS: 4.0000 mg | ORAL_TABLET | Freq: Three times a day (TID) | ORAL | 0 refills | Status: AC | PRN

## 2023-06-08 MED ORDER — CEPHALEXIN 500 MG PO CAPS: 500.0000 mg | ORAL_CAPSULE | Freq: Three times a day (TID) | ORAL | 0 refills | Status: DC

## 2023-06-08 MED ORDER — OXYCODONE-ACETAMINOPHEN 10-325 MG PO TABS: 1.0000 | ORAL_TABLET | Freq: Three times a day (TID) | ORAL | 0 refills | Status: AC | PRN

## 2023-06-09 DIAGNOSIS — M722 Plantar fascial fibromatosis: Secondary | ICD-10-CM | POA: Diagnosis not present

## 2023-06-15 ENCOUNTER — Encounter: Payer: Self-pay | Admitting: Podiatrist

## 2023-06-15 ENCOUNTER — Ambulatory Visit: Payer: Medicare Other | Admitting: Podiatrist

## 2023-06-15 DIAGNOSIS — Z9889 Other specified postprocedural states: Secondary | ICD-10-CM

## 2023-06-15 NOTE — Progress Notes (Signed)
  Chief Complaint  Patient presents with   Routine Post Op    DENIES N/V/F/C/SOB, NO PAIN JUST TENDOR, STILL TAKING THE ABX     HPI: Patient is 67 y.o. male who presents today for POV # 1 DOS 06/09/23 ---EPF WITH PRP INJECTION LEFT FOOT.  He presents today wearing his boot with his dressing intact.  Overall he states he is doing well.  Denies nausea vomiting fevers chills night sweats.  Denies calf pain or tenderness.  Relates he did not take any of the pain medication and use Tylenol as needed for pain.  Has been wearing the boot at night to sleep as well.   Allergies  Allergen Reactions   Sulfa Antibiotics Hives    Review of systems is negative except as noted in the HPI.  Denies nausea/ vomiting/ fevers/ chills or night sweats.   Denies difficulty breathing, denies calf pain or tenderness  Physical Exam  Patient is awake, alert, and oriented x 3.  In no acute distress.    Vascular status is intact with palpable pedal pulses DP and PT bilateral and capillary refill time less than 3 seconds bilateral.  No edema or erythema noted.   Neurological exam reveals epicritic and protective sensation grossly intact bilateral.   Dermatological exam reveals skin is supple and dry to bilateral feet.  Incision sites are well coapted.  Sutures intact.    Musculoskeletal exam: Musculature intact with dorsiflexion, plantarflexion, inversion, eversion. No pain with medial to lateral calf compression. No pain with palpation of plantar foot.  Some ecchymosis present.  No swelling noted.  Excellent post op appearance noted.      Assessment:   ICD-10-CM   1. Status post foot surgery  Z98.890        Plan: Examined the foot post surgery today's visit.  Everything looks great.  The incisions are well coapted with sutures in place.  Not quite ready to be removed at today's visit.  I applied a Band-Aid to the incision sites as well as a Ace wrap and I dispensed a compressive ankle for him to use.   Discussed he can get the foot wet in the shower however he needs to wear the boot to the shower and then remove the boot for shower and then apply the boot again.  He may remove it during the day and recommended calf range of motion exercises throughout the day.  Recommended he continue to wear the boot at night when sleeping.  He has an appointment scheduled for the end of October with Dr. Al Corpus and will follow-up with him then.  Any questions arise prior to that visit he instructed to call.

## 2023-06-15 NOTE — Patient Instructions (Signed)
Continue to wear the boot-  you may remove it to shower and then put it back on for walking.  Contnue to wear the boot at night

## 2023-06-22 ENCOUNTER — Encounter: Payer: Medicare Other | Admitting: Podiatry

## 2023-06-22 NOTE — Progress Notes (Signed)
NEUROLOGY FOLLOW UP OFFICE NOTE  Bryan Sosa [id number]  Assessment/Plan:   Idiopathic Parkinson's disease.  C-L has helped with rigidity but not tremor.     Start trihexyphenidyl titrating to 1mg  three times daily to treat tremor Continue carbidopa-levodopa 25/100mg  three times daily Encourage routine exercise Follow up 6 months.       Subjective:  Bryan Sosa is a 67 year old left handed male with HTN and migraines who follows up for Parkinson's disease.     UPDATE: Current medication:  carbidopa levodopa 25/100mg  1 tablet three times daily (7:30-8 AM, 5:30 PM, 11PM)  Tremor has become more bothersome and interfering with daily activities.  HISTORY: In late 2022, he started noticing tremor in his left hand.  It is difficult when he is brushing his teeth or holding utensils.  He started having slurred speech and difficulty speaking as well.  Some mild trouble swallowing.  He moves slower.  It takes a little more time to stand and straighten himself out of a chair.  However he denies freezing or gait instability.  No double vision or weakness.   He also reports short term memory problems.  It takes longer to collect his thoughts on what to say.  MRI of brain without contrast on 11/13/2021 showed nonspecific bilateral periventricular white matter T2/FLAIR foci increased compared to prior MRI from 2012.  Differential diagnosis on radiology report raised concern for demyelinating disease.  He reports that he has had poor sense of smell for many years.  Also, he has a history of having vivid dreams and sometimes acting out in his sleep, suspicious for REM sleep behavior disorder.  It was noticeable after starting new medications, such as blood pressure medication, but not so much now.  He also endorses erectile dysfunction.  There is a family history of dementia in both parents and his uncle had Lewy Body Dementia.     09/23/2010 MRI BRAIN WO:  1.  Periventricular white matter  changes.  These are nonspecific, but can be seen and myelination process such as multiple sclerosis.  Microvascular ischemic changes can give a similar appearance. Similar findings can be seen in the setting of chronic migraine headaches or with a vasculitis. 2.  No acute intracranial abnormality.  11/13/2021 MRI BRAIN WO:  1. Foci of FLAIR signal abnormality in the periventricular white matter bilaterally are increased in extent since 2012. These lesions are nonspecific, but the configuration raises suspicion for demyelination specifically multiple sclerosis. Differential includes sequela of chronic small vessel ischemia, vasculitis, among other etiologies.  2. Otherwise, no evidence of acute intracranial pathology.  PAST MEDICAL HISTORY: Past Medical History:  Diagnosis Date   Anxiety    Depression    ED (erectile dysfunction)    GERD (gastroesophageal reflux disease)    Headache    migraines   History of kidney stones    Hypertension    Insomnia    PVC (premature ventricular contraction)     MEDICATIONS: Current Outpatient Medications on File Prior to Visit  Medication Sig Dispense Refill   alfuzosin (UROXATRAL) 10 MG 24 hr tablet Take 10 mg by mouth daily with breakfast.     amLODipine (NORVASC) 10 MG tablet Take 10 mg by mouth daily.     aspirin-acetaminophen-caffeine (EXCEDRIN MIGRAINE) 250-250-65 MG tablet 2 tablets as needed for pain     atorvastatin (LIPITOR) 10 MG tablet SMARTSIG:1 Tablet(s) By Mouth Every Evening     baclofen (LIORESAL) 10 MG tablet Take 1 tablet (10  mg total) by mouth 3 (three) times daily. 30 each 0   Cyanocobalamin (VITAMIN B12) 1000 MCG TBCR 1 tablet Orally Once a day for 30 day(s)     DULoxetine (CYMBALTA) 60 MG capsule TAKE 1 CAPSULE BY MOUTH DAILY 90 capsule 0   finasteride (PROSCAR) 5 MG tablet Take 5 mg by mouth daily.     irbesartan (AVAPRO) 300 MG tablet Take 300 mg by mouth daily.     LAGEVRIO 200 MG CAPS capsule SMARTSIG:4 Capsule(s) By Mouth  Every 12 Hours     metoprolol tartrate (LOPRESSOR) 50 MG tablet Take 50 mg by mouth 3 (three) times daily.     ondansetron (ZOFRAN) 4 MG tablet Take 1 tablet (4 mg total) by mouth every 8 (eight) hours as needed. 20 tablet 0   risperiDONE (RISPERDAL) 1 MG tablet Take 1 mg by mouth at bedtime.     sildenafil (VIAGRA) 100 MG tablet Take 100 mg by mouth daily as needed for erectile dysfunction.     tadalafil (CIALIS) 5 MG tablet Take 5 mg by mouth daily as needed for erectile dysfunction.     traZODone (DESYREL) 50 MG tablet Take 1 tablet by mouth at bedtime. 30 tablet 4   No current facility-administered medications on file prior to visit.     ALLERGIES: Allergies  Allergen Reactions   Sulfa Antibiotics Hives    FAMILY HISTORY: Family History  Problem Relation Age of Onset   Dementia Mother    Ulcers Mother    Heart attack Father    Dementia Maternal Aunt    Dementia Paternal Uncle       Objective:  Blood pressure 123/77, pulse 78, resp. rate 18, height 6\' 2"  (1.88 m), weight 253 lb (114.8 kg), SpO2 99%. General: No acute distress.  Patient appears well-groomed.   Head:  Normocephalic/atraumatic Eyes:  Fundi examined but not visualized Neck: supple, no paraspinal tenderness, full range of motion Heart:  Regular rate and rhythm Neurological Exam: Alert and oriented.  Speech fluent and not dysarthric.  Language intact.  Hypomimia.  Hypophonic.  CN II-XII intact.  Bulk and tone normal.  Muscle strength 5/5 throughout.  Reduced finger-thumb tapping speed and amplitude.  Otherwise, no bradykinesia.  Left resting tremor.  Deep tendon reflexes 2+ throughout.  Finger to nose testing intact.  Antalgic gait (sprained his foot) but with upright posture, slightly reduced left arm swing and no shuffling.  Romberg negative. negative   [name], DO  CC: Bryan Beams, MD

## 2023-06-26 ENCOUNTER — Encounter: Payer: Self-pay | Admitting: Neurology

## 2023-06-26 ENCOUNTER — Ambulatory Visit (INDEPENDENT_AMBULATORY_CARE_PROVIDER_SITE_OTHER): Payer: Medicare Other | Admitting: Neurology

## 2023-06-26 VITALS — BP 123/77 | HR 78 | Resp 18 | Ht 74.0 in | Wt 253.0 lb

## 2023-06-26 DIAGNOSIS — G20A1 Parkinson's disease without dyskinesia, without mention of fluctuations: Secondary | ICD-10-CM | POA: Diagnosis not present

## 2023-06-26 MED ORDER — TRIHEXYPHENIDYL HCL 2 MG PO TABS: ORAL_TABLET | ORAL | 0 refills | Status: DC

## 2023-06-26 MED ORDER — CARBIDOPA-LEVODOPA 25-100 MG PO TABS: 1.0000 | ORAL_TABLET | Freq: Three times a day (TID) | ORAL | 5 refills | Status: DC

## 2023-06-26 NOTE — Patient Instructions (Signed)
Start trihexyphenidyl and increase dose as directed Continue carbidopa-levodopa three times daily Follow up 6 months.

## 2023-06-29 ENCOUNTER — Encounter: Payer: Self-pay | Admitting: Podiatry

## 2023-06-29 ENCOUNTER — Ambulatory Visit: Payer: Medicare Other | Admitting: Podiatry

## 2023-06-29 ENCOUNTER — Ambulatory Visit (INDEPENDENT_AMBULATORY_CARE_PROVIDER_SITE_OTHER): Payer: Medicare Other | Admitting: Psychiatry

## 2023-06-29 ENCOUNTER — Encounter: Payer: Self-pay | Admitting: Psychiatry

## 2023-06-29 DIAGNOSIS — F429 Obsessive-compulsive disorder, unspecified: Secondary | ICD-10-CM | POA: Diagnosis not present

## 2023-06-29 DIAGNOSIS — F5105 Insomnia due to other mental disorder: Secondary | ICD-10-CM

## 2023-06-29 DIAGNOSIS — Z9889 Other specified postprocedural states: Secondary | ICD-10-CM

## 2023-06-29 DIAGNOSIS — F4010 Social phobia, unspecified: Secondary | ICD-10-CM

## 2023-06-29 DIAGNOSIS — S93692D Other sprain of left foot, subsequent encounter: Secondary | ICD-10-CM

## 2023-06-29 MED ORDER — TRAZODONE HCL 50 MG PO TABS
50.0000 mg | ORAL_TABLET | Freq: Every day | ORAL | 1 refills | Status: DC
Start: 2023-06-29 — End: 2023-12-27

## 2023-06-29 MED ORDER — DULOXETINE HCL 60 MG PO CPEP
60.0000 mg | ORAL_CAPSULE | Freq: Every day | ORAL | 1 refills | Status: DC
Start: 2023-06-29 — End: 2023-12-27

## 2023-06-29 NOTE — Progress Notes (Signed)
Bryan Sosa [id number] April 02, 1956 67 y.o.  Subjective:   Patient ID:  Bryan Sosa is a 67 y.o. (DOB Feb 24, 1956) male.  Chief Complaint:  Chief Complaint  Patient presents with   Follow-up   Anxiety    HPI Bryan Sosa presents to the office today for follow-up of OCD.  seen in december 2020.  No meds were changed.  04/16/20 appt with following noted: Still on meds including Xanax 0.5 mg tab 1/2 tab TID and 1 tab HS, risperidone 1/2 mg HS and duloxetine 60 mg daily. About the same.  OK I guess and not worse.  Anxious from time to time. Wonders if meds cause bad dreams, but more often vivid dreams.  No problems with the meds.  Consistent with meds.  Usually sleep OK. OCD under good enough control. No longer problems with sleepiness.  Anxiety is good overall.  No sig problems with intrusive or obsessive thoughts now with the medicines.  Satisfied with meds.  Still dreams of working at [employer].  Helped to get OOB earlier with reduction Risperidone to 0.5 mg HS.  Likes having the 1 mg tablets.  Consistent with psych meds.  OCD is managed mostly.   Sporadically working at Pharmacist, hospital.  Gained 20 # but inactive more. Reduced risperidone from 1 to 1/2 mg Hs did reduce sleepiness.   No worsening of anxiety after the change.  Still has anxiety at times, no worse nor better.  No depression.  Sleep at least 8 hours. In bed 10 + hours bc retired.   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  Has retired. Plan: No further med changes  10/15/2020 appointment with the following noted: He stopped risperidone about 03/2020 without difficulty except a little trouble with sleep awakening.   Asked Bryan Sosa about this and excessive sleepiness. Taking alprazolam 0.25 mg TID and 0.5 mg HS.Bryan Sosa They are wondering about OSA.   To bed 1230 and up 930.  Toss and turn.  Wife says he snores.  He plans to go back there if continues with this problem. Doing fine.  Joined gym 2 hours day and 5 days per  week helping mood. Not sure about depression.  Residual anxiety and would like to continue meds. No avoidance as far as he knows. Weight about 241#.  Questions about meds and weight gain. Plan: No med changes. Continue alprazolam 0.5 mg half 3 times daily and 1 nightly prn, risperidone 1 mg nightly, duloxetine 60 mg daily.  06/30/2021 appointment with the following noted: Usually alprazolam 0.25 mg am and 0.5 mg HS. Good overall.  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  Pleased. Anxiety mostly OK.  Occ bouts are less often.  Time to time obs but nowhere near as bad as it was. Noticed positional tremor resting.   No sleep study. Went off risperidone for 10 days and starting having sleep problems so restarted it.   Not depressed.  Trouble staying asleep at times.  Might nap 2 days per week for 60-90 mins and other days 2 hour naps.  Then in bed 9 hours.  Doesn't think his sleep is that good.  May go to bed out of boredom. 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 D pregnant and due Dec with first grandchild. He stopped and restarted risperidone.   Plan: He doesn't want further med changes. Continue duloxetine 60, risperidone 1, alprazolam prn.  03/30/2022 appointment noted: Sciatica on R and using crutches for 3 weeks.  Has been to doctor and again on Monday. Dx PD May 2023. Dr Bryan Sosa. Started Sinemet.  Not helped so far. Reduced risperidone to 1/2 tablet at night and still on duloxetine 60 mg daily, still taking alprazolam 0.25 mg AM and 0.5 mg HS Didn't want to stop risperidone bc helps with sleep some.  Was sleeping good before risperidone. Never completely stopped it. Anxiety and OCD is no worse than last time and relatively controlled for the last couple of years. Plan: Continue duloxetine 60 DC risperidone DT dx PD and tremor For sleep trazodone 50 mg HS Ok to continue low dose alprazolam  11/24/22 appt noted: DX PD. Stopped risperidone  and not sure about tremor and anxiety is  not worse. Trying to come off Xanax.  Had read about BZ causing dementia.  Reduced to 1/2 Xanax 0.5 BID fior a few weeks and stopped 1-2 weeks ago. 2 and stopped. Don't sleep as well without it.  To bed 12 and up at 10AM.   Watches youtuve and reads in bed. Awakens 3 AM to urinate.   Still dealing with nausea for a week which has affected sleep.  Slept longer with Xanax. Wife concerns about his memory and sedentary life now that retired.  He is interested in things.  Forgot anniversary date.  Does not get lost driving.   Tremor in dominant hand.   Not as patient.  Anxiety through the roof driving to [place]. Psych meds: duloxetine 60 , trazodone 50 HS M dementia and uncle Lewey Body dementia. Not depressed.  No panic.   No problem with intrusive thoughts usually. ED for years. ED meds a little helpful but not much.  Tried injections which helped a while.  05/29/23 NS  06/29/23 appt noted: Psych meds: duloxetine 60 , trazodone 50 HS Had foot surgery and it's going well. Anxiety is "OK I guess" without marked change. Has worried abotu some of med saying it might cause hallucianations.  In particular PD.   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   Still dreaming but less with lower dose of trazodone.  Negative work dreams.      Past Psychiatric Medication Trials: Sertraline diarrhea, fluvoxamine sedation, fluoxetine cognitive side effects, paroxetine side effects,  risperidone 1 mg,  Xanax Duloxetine 60 mg since September 2019  Review of Systems:  Review of Systems  Constitutional:  Negative for unexpected weight change.  Cardiovascular:  Negative for palpitations.  Musculoskeletal:  Positive for back pain and gait problem.  Neurological:  Positive for tremors. Negative for weakness.  Psychiatric/Behavioral:  Positive for sleep disturbance. Negative for agitation, behavioral problems, confusion, decreased concentration, dysphoric mood, hallucinations,  self-injury and suicidal ideas. The patient is not nervous/anxious and is not hyperactive.     Medications: I have reviewed the patient's current medications.  Current Outpatient Medications  Medication Sig Dispense Refill   alfuzosin (UROXATRAL) 10 MG 24 hr tablet Take 10 mg by mouth daily with breakfast.     amLODipine (NORVASC) 10 MG tablet Take 10 mg by mouth daily.     aspirin-acetaminophen-caffeine (EXCEDRIN MIGRAINE) 250-250-65 MG tablet 2 tablets as needed for pain     atorvastatin (LIPITOR) 10 MG tablet SMARTSIG:1 Tablet(s) By Mouth Every Evening     baclofen (LIORESAL) 10 MG tablet Take 1 tablet (10 mg total) by mouth 3 (three) times daily. 30 each 0   carbidopa-levodopa (SINEMET IR) 25-100 MG tablet Take 1 tablet by mouth 3 (three) times daily. 90 tablet 5  Cyanocobalamin (VITAMIN B12) 1000 MCG TBCR 1 tablet Orally Once a day for 30 day(s)     DULoxetine (CYMBALTA) 60 MG capsule TAKE 1 CAPSULE BY MOUTH DAILY 90 capsule 0   finasteride (PROSCAR) 5 MG tablet Take 5 mg by mouth daily.     irbesartan (AVAPRO) 300 MG tablet Take 300 mg by mouth daily.     LAGEVRIO 200 MG CAPS capsule SMARTSIG:4 Capsule(s) By Mouth Every 12 Hours     metoprolol tartrate (LOPRESSOR) 50 MG tablet Take 50 mg by mouth 3 (three) times daily.     ondansetron (ZOFRAN) 4 MG tablet Take 1 tablet (4 mg total) by mouth every 8 (eight) hours as needed. 20 tablet 0   sildenafil (VIAGRA) 100 MG tablet Take 100 mg by mouth daily as needed for erectile dysfunction.     tadalafil (CIALIS) 5 MG tablet Take 5 mg by mouth daily as needed for erectile dysfunction.     traZODone (DESYREL) 50 MG tablet Take 1 tablet by mouth at bedtime. 30 tablet 4   trihexyphenidyl (ARTANE) 2 MG tablet Take 0.5 tablet daily for one week, then 0.5 tablet twice daily for one week, ,then 0.5 tablet three times daily 75 tablet 0   risperiDONE (RISPERDAL) 1 MG tablet Take 1 mg by mouth at bedtime. (Patient not taking: Reported on 06/29/2023)      No current facility-administered medications for this visit.    Medication Side Effects: None  Allergies:  Allergies  Allergen Reactions   Sulfa Antibiotics Hives    Past Medical History:  Diagnosis Date   Anxiety    Depression    ED (erectile dysfunction)    GERD (gastroesophageal reflux disease)    Headache    migraines   History of kidney stones    Hypertension    Insomnia    PVC (premature ventricular contraction)     Family History  Problem Relation Age of Onset   Dementia Mother    Ulcers Mother    Heart attack Father    Dementia Maternal Aunt    Dementia Paternal Uncle     Social History   Socioeconomic History   Marital status: Married    Spouse name: Not on file   Number of children: Not on file   Years of education: Not on file   Highest education level: Not on file  Occupational History   Not on file  Tobacco Use   Smoking status: Never   Smokeless tobacco: Never  Vaping Use   Vaping status: Never Used  Substance and Sexual Activity   Alcohol use: No   Drug use: No   Sexual activity: Not Currently  Other Topics Concern   Not on file  Social History Narrative   Left handed   Drinks caffeine   Lives with wife sherry   One floor home   unemployed   Social Determinants of Health   Financial Resource Strain: Not on file  Food Insecurity: Not on file  Transportation Needs: Not on file  Physical Activity: Not on file  Stress: Not on file  Social Connections: Not on file  Intimate Partner Violence: Not on file    Past Medical History, Surgical history, Social history, and Family history were reviewed and updated as appropriate.   Please see review of systems for further details on the patient's review from today.   Objective:   Physical Exam:  There were no vitals taken for this visit.  Physical Exam Constitutional:  General: He is not in acute distress.    Appearance: Normal appearance. He is well-developed.   Musculoskeletal:        General: No deformity.  Neurological:     Mental Status: He is alert and oriented to person, place, and time.     Motor: Tremor present.     Coordination: Coordination normal.     Gait: Gait normal.  Psychiatric:        Attention and Perception: Attention and perception normal.        Mood and Affect: Mood is anxious. Mood is not depressed. Affect is not labile, blunt or inappropriate.        Speech: Speech is not delayed.        Behavior: Behavior is not slowed.        Thought Content: Thought content normal. Thought content is not delusional. Thought content does not include homicidal or suicidal ideation. Thought content does not include suicidal plan.        Cognition and Memory: Cognition normal.        Judgment: Judgment normal.     Comments: Insight intact. No auditory or visual hallucinations. No delusions. Chronic scrupulosity obsessions but under good control      Lab Review:     Component Value Date/Time   NA 134 (L) 11/14/2021 1455   K 3.8 11/14/2021 1455   CL 100 11/14/2021 1455   CO2 26 11/14/2021 1455   GLUCOSE 139 (H) 11/14/2021 1455   BUN 14 11/14/2021 1455   CREATININE 1.12 11/14/2021 1455   CALCIUM 8.5 (L) 11/14/2021 1455   PROT 7.2 03/08/2014 2005   ALBUMIN 3.9 03/08/2014 2005   AST 17 03/08/2014 2005   ALT 16 03/08/2014 2005   ALKPHOS 59 03/08/2014 2005   BILITOT 0.3 03/08/2014 2005   GFRNONAA >60 11/14/2021 1455   GFRAA >60 03/28/2017 0820       Component Value Date/Time   WBC 10.6 (H) 11/14/2021 1455   RBC 4.25 11/14/2021 1455   HGB 11.9 (L) 11/14/2021 1455   HCT 36.5 (L) 11/14/2021 1455   PLT 293 11/14/2021 1455   MCV 85.9 11/14/2021 1455   MCH 28.0 11/14/2021 1455   MCHC 32.6 11/14/2021 1455   RDW 13.2 11/14/2021 1455   LYMPHSABS 1.5 11/14/2021 1455   MONOABS 0.8 11/14/2021 1455   EOSABS 0.6 (H) 11/14/2021 1455   BASOSABS 0.1 11/14/2021 1455    No results found for: "POCLITH", "LITHIUM"   No results found  for: "PHENYTOIN", "PHENOBARB", "VALPROATE", "CBMZ"   .res Assessment: Plan:    Stefanos was seen today for follow-up and anxiety.  Diagnoses and all orders for this visit:  Obsessive-compulsive disorder with good or fair insight  Social anxiety disorder  Insomnia due to mental condition   Medication sensitive.  Disc usual course and response of OCD, scrupulosity with meds and unlikely to fully resolve and need for chronic treatment.  Also usually need max dosages of SSRI but he's med sensitive. Importance of activity with retirement otherwise anxiety might be worse but he's done OK with anxiety but limited activity..  Encourage activiity .  He stays busy with volunteering at church and other places.  Asked questions about PD including risk psychosis, SE meds.  And about sx.  Answered questions about PD and mental health.  He's still very concerned about it.  Also concerns about risk dementia.    Disc importance activity to keep mental health.    Watch caffeine and sleep.  Sleep hygiene.  Spending too much time in bed. Sleep restriction.  We discussed the short-term risks associated with benzodiazepines including sedation and increased fall risk among others.  Discussed long-term side effect risk including dependence, potential withdrawal symptoms, and the potential eventual dose-related risk of dementia.  But recent studies from 2020 dispute this association between benzodiazepines and dementia risk. Newer studies in 2020 do not support an association with dementia. Ok to continue low dose alprazolam.  Extensive discussion about his questions of dreaming about work experiences and some were bad but not really NM nor PTSD related.   Answered questions about   Continue duloxetine 60.  Fear relapse if reduce it. For sleep trazodone 50 mg HS.  Option increase it. Ok to continue low dose alprazolam if needed  Fu 6 mos  Meredith Staggers, MD, DFAPA    Please see After Visit Summary for  patient specific instructions.  Future Appointments  Date Time Provider Department Center  07/20/2023 10:15 AM [place], [place] T, [place] [facility]  12/25/2023  2:10 PM Drema [place], DO [facility] None    No orders of the defined types were placed in this encounter.     -------------------------------

## 2023-07-01 NOTE — Progress Notes (Signed)
He presents today for follow-up of his endoscopic plantar fasciotomy and PRP injection left foot.  He states is doing good and does not hurt anymore.  Objective: Vital signs are stable alert oriented x 3 there is no erythema edema cellulitis drainage or odor.  Sutures were intact removed today margins remain well coapted there is no ecchymosis.  No tenderness on palpation of the heel.  Assessment: Well-healing surgical foot  Plan: I recommended that he get back into his regular shoe gear during the day but will continue to use the night splint for the next month.  He will use the night splint or the walking boot at nighttime for the next month he understands this is amenable to it he knows he is not supposed to be barefoot no flip-flops sandals.

## 2023-07-13 ENCOUNTER — Encounter: Payer: Medicare Other | Admitting: Podiatry

## 2023-07-20 ENCOUNTER — Ambulatory Visit (INDEPENDENT_AMBULATORY_CARE_PROVIDER_SITE_OTHER): Payer: Medicare Other | Admitting: Podiatry

## 2023-07-20 ENCOUNTER — Encounter: Payer: Self-pay | Admitting: Podiatry

## 2023-07-20 DIAGNOSIS — S93692D Other sprain of left foot, subsequent encounter: Secondary | ICD-10-CM

## 2023-07-20 DIAGNOSIS — Z9889 Other specified postprocedural states: Secondary | ICD-10-CM

## 2023-07-20 NOTE — Progress Notes (Signed)
He presents today for postop visit date of surgery is June 09, 2023.  We performed an endoscopic plantar fasciotomy left foot with a PRP injection.  He states he is doing good occasionally gets a little sharp pain he wants to know if he can come out of his boot at nighttime.  Objective: Vital signs are stable he is alert oriented x 3 there is no erythema edema salines drainage odor incision sites: Healing dental he has no reproducible pain on palpation.  Assessment: Resolving endoscopic plantar fasciotomy.  Plan: Discussed etiology pathology conservative surgical therapies at this point I think he can come out of the cam boot at night he will start stress in his foot a little more over the next month I will follow-up with him at that time hopefully for release.

## 2023-08-24 ENCOUNTER — Other Ambulatory Visit: Payer: Self-pay | Admitting: Neurology

## 2023-08-31 ENCOUNTER — Encounter: Payer: Self-pay | Admitting: Podiatry

## 2023-08-31 ENCOUNTER — Ambulatory Visit (INDEPENDENT_AMBULATORY_CARE_PROVIDER_SITE_OTHER): Payer: Medicare Other | Admitting: Podiatry

## 2023-08-31 DIAGNOSIS — S93692D Other sprain of left foot, subsequent encounter: Secondary | ICD-10-CM

## 2023-08-31 DIAGNOSIS — Z9889 Other specified postprocedural states: Secondary | ICD-10-CM

## 2023-08-31 NOTE — Progress Notes (Signed)
 Bryan Sosa presents today this is his third postop visit date of surgery was 06/09/2023.  He is status post endoscopic plantar fasciotomy with PRP injection left foot.  States that the left foot is doing great and the right heel is starting to get a little sore occasionally.  Objective: Vital signs are stable alert oriented x 3.  There is no erythema edema cellulitis drainage or odor to the left foot.  He has no ingrown nails no paronychia no abscesses.  No palpable fascia on the medial band of that left foot.  He has very little in the way of tenderness on palpation to his right heel.  Assessment: Well-healing surgical foot left.  Possible compensatory fasciitis right foot.  Plan: Allow him to get back to his regular routine to help with his Parkinson's disease should he need an injection in that right foot he will notify us  immediately.

## 2023-09-12 IMAGING — MR MR HEAD W/O CM
10 series · 48 of 48 positions shown · non-contrast
Comparison: CT head 03/28/2017, MR head 09/23/2010

CLINICAL DATA: Left arm tremors for 1 month

EXAM:
MRI HEAD WITHOUT CONTRAST
TECHNIQUE: Multiplanar, multiecho pulse sequences of the brain and surrounding
structures were obtained without intravenous contrast.

[Series 2: T1 · sagittal · 5.0mm · 0.45mm/px · 3 of 25 slices shown]
[im 1/25]
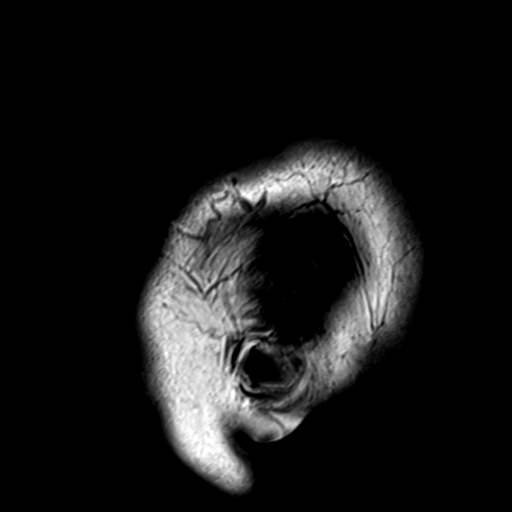
[im 13/25]
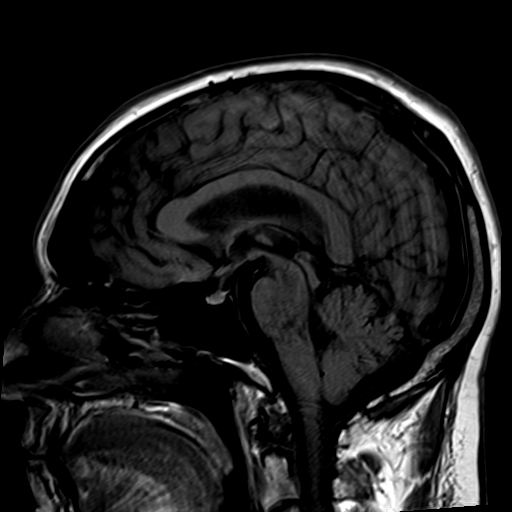
[im 25/25]
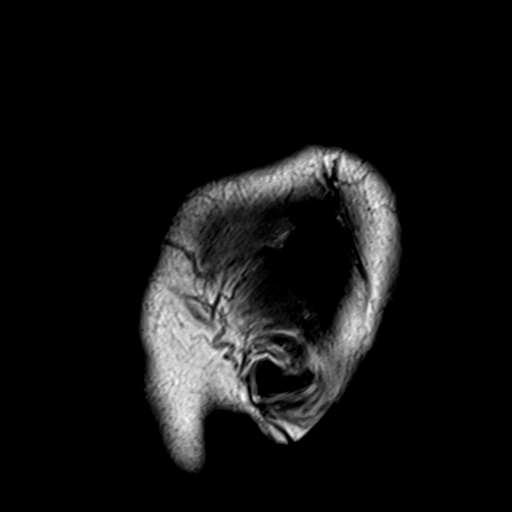

[Series 3: ax ep2d_diff_3 · axial · 3.0mm · 1.80mm/px · z∈[-53,+109]mm · 9 of 109 slices shown]
[im 1/109]
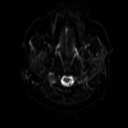
[im 14/109]
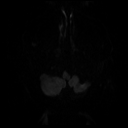
[im 28/109]
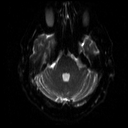
[im 41/109]
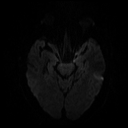
[im 55/109]
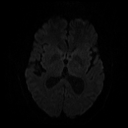
[im 68/109]
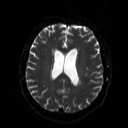
[im 82/109]
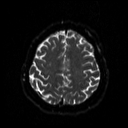
[im 95/109]
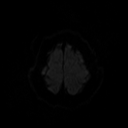
[im 109/109]
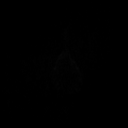

[Series 4: ax ep2d_diff_3_adc · axial · 3.0mm · 1.80mm/px · z∈[-53,+109]mm · 4 of 55 slices shown]
[im 1/55]
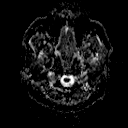
[im 19/55]
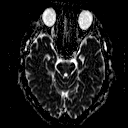
[im 37/55]
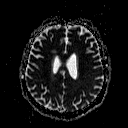
[im 55/55]
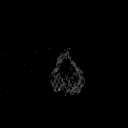

[Series 5: cor ep2d_diff · coronal · 5.0mm · 1.77mm/px · 5 of 59 slices shown]
[im 1/59]
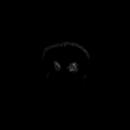
[im 15/59]
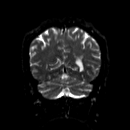
[im 30/59]
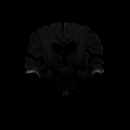
[im 44/59]
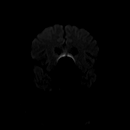
[im 59/59]
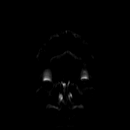

[Series 6: cor ep2d_diff_adc · coronal · 5.0mm · 1.77mm/px · 2 of 30 slices shown]
[im 1/30]
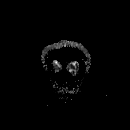
[im 30/30]
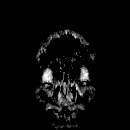

[Series 8: swi_images · axial · 2.0mm · 0.98mm/px · z∈[-53,+105]mm · 6 of 80 slices shown]
[im 1/80]
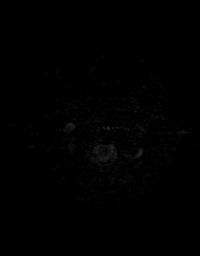
[im 16/80]
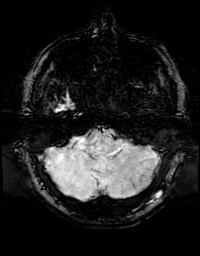
[im 32/80]
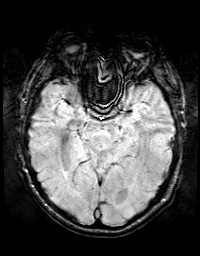
[im 48/80]
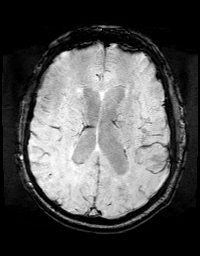
[im 64/80]
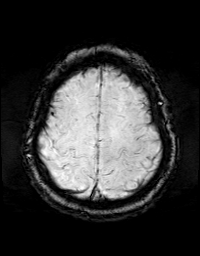
[im 80/80]
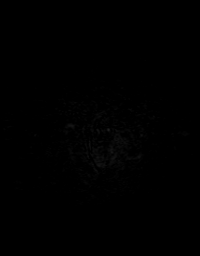

[Series 9: FLAIR · axial · 3.0mm · 0.43mm/px · z∈[-51,+101]mm · 3 of 40 slices shown]
[im 1/40]
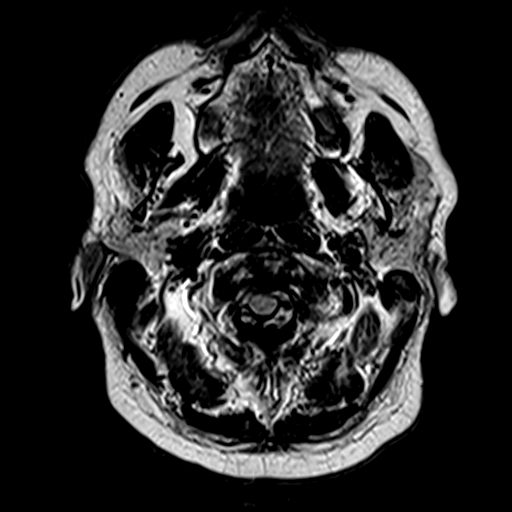
[im 20/40]
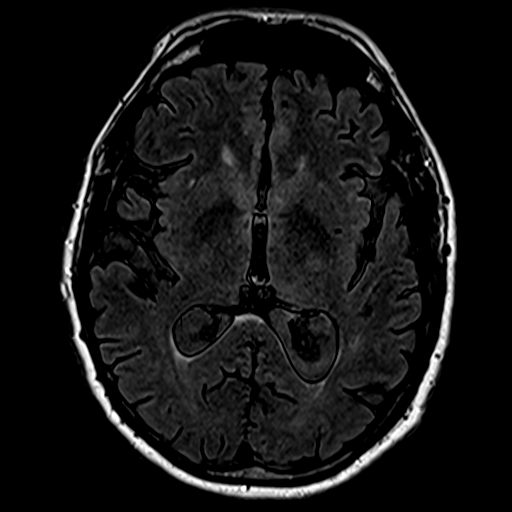
[im 40/40]
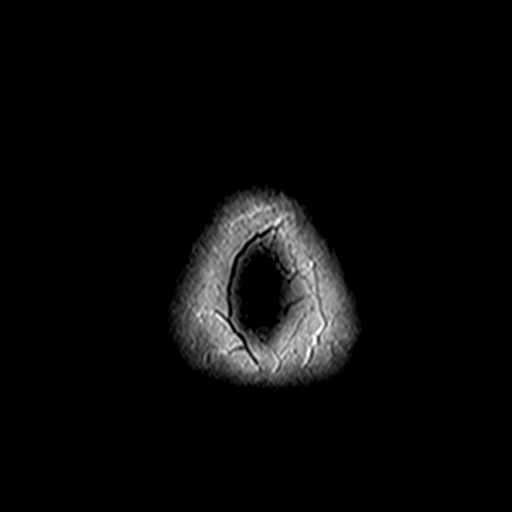

[Series 10: T2 · axial · 5.0mm · 0.65mm/px · z∈[-58,+110]mm · 2 of 29 slices shown (1 of 2)]
[im 1/29]
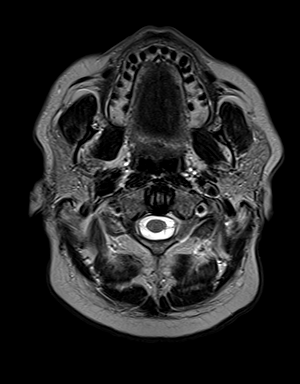
[im 29/29]
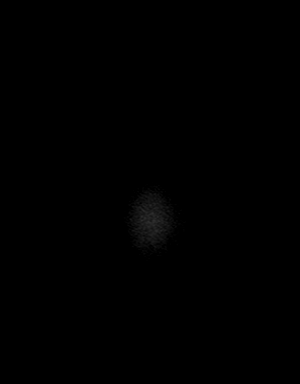

[Series 11: t1_mpr_tra · axial · 1.0mm · 0.72mm/px · z∈[-50,+115]mm · 12 of 160 slices shown]
[im 1/160]
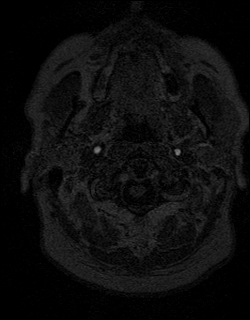
[im 15/160]
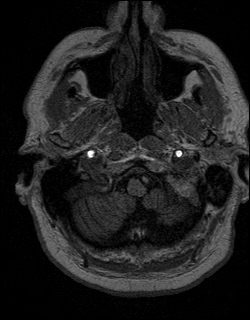
[im 29/160]
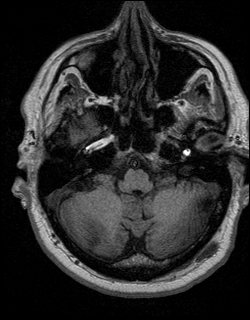
[im 44/160]
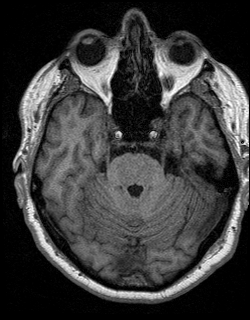
[im 58/160]
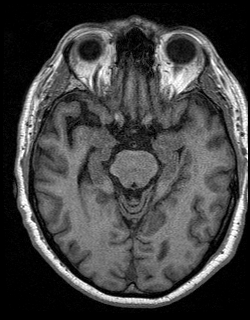
[im 73/160]
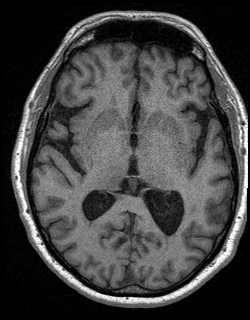
[im 87/160]
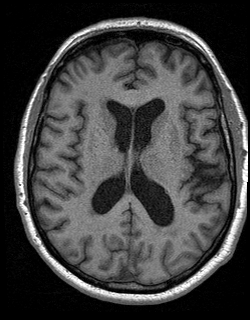
[im 102/160]
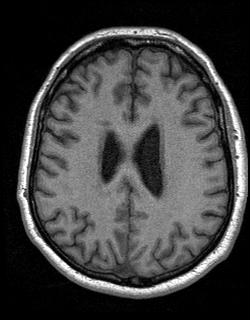
[im 116/160]
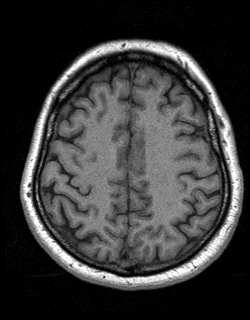
[im 131/160]
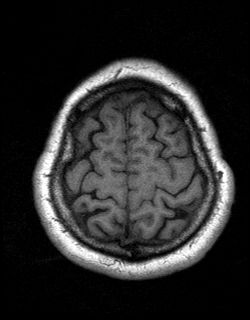
[im 145/160]
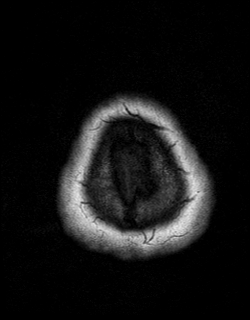
[im 160/160]
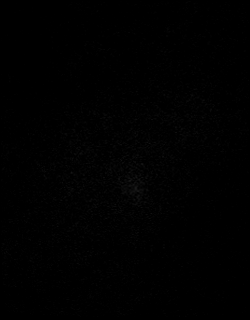

[Series 12: T2 · coronal · 5.0mm · 0.43mm/px · 2 of 30 slices shown (2 of 2)]
[im 1/30]
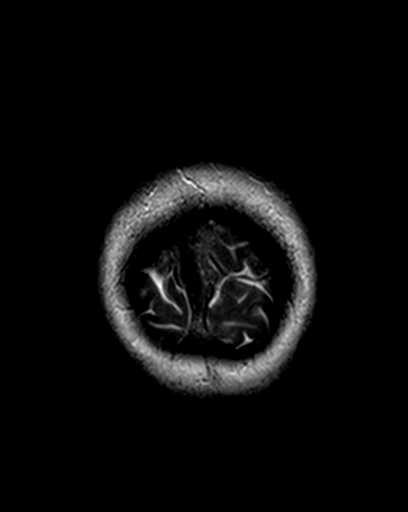
[im 30/30]
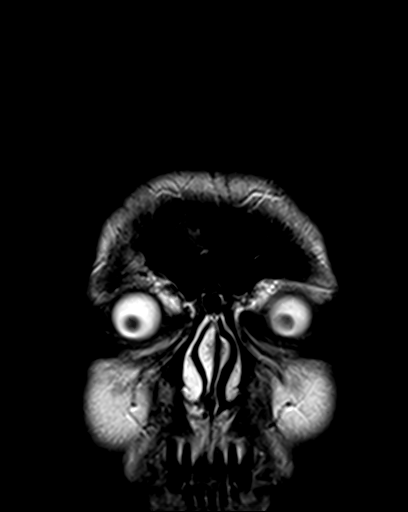

[48 of 48 positions shown; findings below may reference images not displayed]

FINDINGS: Brain: There is no evidence of acute intracranial hemorrhage,
extra-axial fluid collection, or acute infarct.

Background parenchymal volume is normal. The ventricles are normal
in size.

There are patchy foci of FLAIR signal abnormality in the
periventricular white matter bilaterally, similar in configuration
to the prior study from 09/23/2010, but increased in conspicuity
with a new rounded lesion in the right centrum semiovale. The
configuration raises suspicion for multiple sclerosis. There is no
diffusion restriction to suggest active demyelination.

There is no solid mass lesion. There is no mass effect or midline
shift.

Vascular: Normal flow voids.

Skull and upper cervical spine: Normal marrow signal.

Sinuses/Orbits: The paranasal sinuses are clear. The globes and
orbits are unremarkable. The optic nerves are grossly unremarkable
on this nondedicated study.

Other: None.
IMPRESSION: 1. Foci of FLAIR signal abnormality in the periventricular white
matter bilaterally are increased in extent since 2952. These lesions
are nonspecific, but the configuration raises suspicion for
demyelination specifically multiple sclerosis. Differential includes
sequela of chronic small vessel ischemia, vasculitis, among other
etiologies.
2. Otherwise, no evidence of acute intracranial pathology.

## 2023-09-20 ENCOUNTER — Encounter: Payer: Self-pay | Admitting: Neurology

## 2023-12-04 ENCOUNTER — Other Ambulatory Visit (INDEPENDENT_AMBULATORY_CARE_PROVIDER_SITE_OTHER)

## 2023-12-04 ENCOUNTER — Ambulatory Visit: Admitting: Physician Assistant

## 2023-12-04 ENCOUNTER — Encounter: Payer: Self-pay | Admitting: Physician Assistant

## 2023-12-04 DIAGNOSIS — M25552 Pain in left hip: Secondary | ICD-10-CM

## 2023-12-04 DIAGNOSIS — M25551 Pain in right hip: Secondary | ICD-10-CM

## 2023-12-04 DIAGNOSIS — M7061 Trochanteric bursitis, right hip: Secondary | ICD-10-CM | POA: Diagnosis not present

## 2023-12-04 DIAGNOSIS — M7062 Trochanteric bursitis, left hip: Secondary | ICD-10-CM

## 2023-12-04 MED ORDER — METHYLPREDNISOLONE ACETATE 40 MG/ML IJ SUSP
40.0000 mg | INTRAMUSCULAR | Status: AC | PRN
Start: 2023-12-04 — End: 2023-12-04
  Administered 2023-12-04: 40 mg via INTRA_ARTICULAR

## 2023-12-04 MED ORDER — LIDOCAINE HCL 1 % IJ SOLN
3.0000 mL | INTRAMUSCULAR | Status: AC | PRN
Start: 2023-12-04 — End: 2023-12-04
  Administered 2023-12-04: 3 mL

## 2023-12-04 NOTE — Progress Notes (Signed)
 Office Visit Note   Patient: Bryan Sosa           Date of Birth: 12-Jun-1956           MRN: [id number] Visit Date: 12/04/2023              Requested by: Carilyn Goodpasture, NP [phone] [address] [place],  [place] [address] PCP: [name], MD   Assessment & Plan: Visit Diagnoses:  1. Bilateral hip pain   2. Trochanteric bursitis, left hip   3. Trochanteric bursitis, right hip     Plan:  He was shown IT band stretching exercises.  Offered cortisone injection right hip for both diagnostic and therapeutic purposes hopefully.  He will follow-up with [place] in 2 weeks see how he is doing overall.  Questions were encouraged and answered.  Follow-Up Instructions: Return in about 2 weeks (around 12/18/2023).   Orders:  Orders Placed This Encounter  Procedures   Large Joint Inj   XR Pelvis 1-2 Views   No orders of the defined types were placed in this encounter.     Procedures: Large Joint Inj: R greater trochanter on 12/04/2023 11:31 AM Indications: pain Details: 22 G 1.5 in needle, lateral approach  Arthrogram: No  Medications: 3 mL lidocaine 1 %; 40 mg methylPREDNISolone acetate 40 MG/ML Outcome: tolerated well, no immediate complications Procedure, treatment alternatives, risks and benefits explained, specific risks discussed. Consent was given by the patient. Immediately prior to procedure a time out was called to verify the correct patient, procedure, equipment, support staff and site/side marked as required. Patient was prepped and draped in the usual sterile fashion.       Clinical Data: No additional findings.   Subjective: Chief Complaint  Patient presents with   Right Hip - Pain   Left Hip - Pain    HPI Bryan Sosa 68 year old male were seen for the first time for bilateral hip pain.  He had bilateral hip pain for the past 6 months.  Pain is worse after sitting for long periods of time.  Also nights lying on either hip at night bothers him.  He denies  any numbness tingling down either leg.  Denies any back pain.  Denies any groin pain.  No injury to either hip.  He has tried Tylenol arthritis without any real relief.  States pain is better after the first 3-4 steps.  Notes the right hip pain is slightly worse than the left.  Patient is nondiabetic.  Review of Systems Negative for fevers chills.  Objective: Vital Signs: There were no vitals taken for this visit.  Physical Exam Constitutional:      Appearance: He is not ill-appearing or diaphoretic.  Pulmonary:     Effort: Pulmonary effort is normal.  Neurological:     Mental Status: He is alert and oriented to person, place, and time.  Psychiatric:        Mood and Affect: Mood normal.     Ortho Exam Bilateral hips good range of motion of both hips.  External rotation of bilateral hips causes pain laterally near the trochanteric region.  Slight tenderness both hips over the trochanteric regions.  Bilateral knees good range of motion without pain.  Nontender both knees.  No erythema abnormal warmth or effusion either knee.  Specialty Comments:  No specialty comments available.  Imaging: XR Pelvis 1-2 Views Result Date: 12/04/2023 AP: Bilateral hips well located.  No bony lesions, cystic changes or evidence of AVN either  hip.  No acute fractures.  Both hips with overall well-maintained.    PMFS History: Patient Active Problem List   Diagnosis Date Noted   BPH (benign prostatic hyperplasia) 10/13/2021   Dysfunction of left eustachian tube 01/08/2021   Mixed conductive and sensorineural hearing loss of left ear with restricted hearing of right ear 12/17/2020   Abnormal ear sensation, left 12/17/2020   OCD (obsessive compulsive disorder) 08/08/2018   Social anxiety disorder 08/08/2018   Insomnia 08/08/2018   Pain in the chest 05/07/2015   Obesity 05/07/2015   Family history of early CAD 05/07/2015   Essential hypertension 05/07/2015   Past Medical History:  Diagnosis Date    Anxiety    Depression    ED (erectile dysfunction)    GERD (gastroesophageal reflux disease)    Headache    migraines   History of kidney stones    Hypertension    Insomnia    PVC (premature ventricular contraction)     Family History  Problem Relation Age of Onset   Dementia Mother    Ulcers Mother    Heart attack Father    Dementia Maternal Aunt    Dementia Paternal Uncle     Past Surgical History:  Procedure Laterality Date   bladder neck obstruction     2008   XI ROBOTIC ASSISTED SIMPLE PROSTATECTOMY N/A 10/13/2021   Procedure: XI ROBOTIC ASSISTED SIMPLE PROSTATECTOMY;  Surgeon: Sebastian Ache, MD;  Location: [facility];  Service: Urology;  Laterality: N/A;   Social History   Occupational History   Not on file  Tobacco Use   Smoking status: Never   Smokeless tobacco: Never  Vaping Use   Vaping status: Never Used  Substance and Sexual Activity   Alcohol use: No   Drug use: No   Sexual activity: Not Currently

## 2023-12-18 ENCOUNTER — Encounter: Payer: Self-pay | Admitting: Physician Assistant

## 2023-12-18 ENCOUNTER — Ambulatory Visit (INDEPENDENT_AMBULATORY_CARE_PROVIDER_SITE_OTHER): Admitting: Physician Assistant

## 2023-12-18 DIAGNOSIS — M7061 Trochanteric bursitis, right hip: Secondary | ICD-10-CM | POA: Diagnosis not present

## 2023-12-18 NOTE — Progress Notes (Signed)
 HPI: Mr. Mirsky returns today status post right hip injection for trochanteric bursitis on 12/03/2024.  He states for a week he got about 70% relief.  His pain however is returned.  He has really had no pain in the left hip which she was having.  Due to his wife having rotator cuff surgery he has not done any stretching.  Denies any numbness tingling down the leg any groin pain.  Review of systems see HPI otherwise negative  Physical exam: General: Well-developed well-nourished male who ambulates with a nonantalgic gait no assistive device. Bilateral hips: Good range of motion both hips.  Slight discomfort lateral aspect right hip with internal rotation.  Otherwise full motion of both hips.  Tenderness over the right hip trochanteric region no tenderness over the left hip trochanteric region  Impression: Right hip trochanteric bursitis  Plan: He is given a prescription for physical therapy he will take this to the same therapist his wife is seeing.  Prescriptions for therapy to evaluate and treat work on range of motion, stretching, include home exercise program for right hip trochanteric bursitis.  Follow-up with Dr. Heide Livings in 6 weeks if pain persist.

## 2023-12-25 ENCOUNTER — Ambulatory Visit: Payer: Medicare Other | Admitting: Neurology

## 2023-12-27 ENCOUNTER — Ambulatory Visit (INDEPENDENT_AMBULATORY_CARE_PROVIDER_SITE_OTHER): Payer: Medicare Other | Admitting: Psychiatry

## 2023-12-27 ENCOUNTER — Encounter: Payer: Self-pay | Admitting: Psychiatry

## 2023-12-27 DIAGNOSIS — F429 Obsessive-compulsive disorder, unspecified: Secondary | ICD-10-CM

## 2023-12-27 DIAGNOSIS — F4010 Social phobia, unspecified: Secondary | ICD-10-CM

## 2023-12-27 DIAGNOSIS — F5105 Insomnia due to other mental disorder: Secondary | ICD-10-CM | POA: Diagnosis not present

## 2023-12-27 MED ORDER — TRAZODONE HCL 50 MG PO TABS: 50.0000 mg | ORAL_TABLET | Freq: Every day | ORAL | 3 refills | Status: AC

## 2023-12-27 MED ORDER — DULOXETINE HCL 60 MG PO CPEP
60.0000 mg | ORAL_CAPSULE | Freq: Every day | ORAL | 3 refills | Status: AC
Start: 2023-12-27 — End: ?

## 2023-12-27 NOTE — Progress Notes (Signed)
 Bryan Sosa [id number] 06/27/1956 68 y.o.  Subjective:   Patient ID:  Bryan Sosa is a 68 y.o. (DOB 1955/10/14) male.  Chief Complaint:  Chief Complaint  Patient presents with   Follow-up    HPI [name] presents to the office today for follow-up of OCD.  seen in december 2020.  No meds were changed.  04/16/20 appt with following noted: Still on meds including Xanax  0.5 mg tab 1/2 tab TID and 1 tab HS, risperidone  1/2 mg HS and duloxetine  60 mg daily. About the same.  OK I guess and not worse.  Anxious from time to time. Wonders if meds cause bad dreams, but more often vivid dreams.  No problems with the meds.  Consistent with meds.  Usually sleep OK. OCD under good enough control. No longer problems with sleepiness.  Anxiety is good overall.  No sig problems with intrusive or obsessive thoughts now with the medicines.  Satisfied with meds.  Still dreams of working at [employer].  Helped to get OOB earlier with reduction Risperidone  to 0.5 mg HS.  Likes having the 1 mg tablets.  Consistent with psych meds.  OCD is managed mostly.   Sporadically working at Pharmacist, hospital.  Gained 20 # but inactive more. Reduced risperidone  from 1 to 1/2 mg Hs did reduce sleepiness.   No worsening of anxiety after the change.  Still has anxiety at times, no worse nor better.  No depression.  Sleep at least 8 hours. In bed 10 + hours bc retired.   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  Has retired. Plan: No further med changes  10/15/2020 appointment with the following noted: He stopped risperidone  about 03/2020 without difficulty except a little trouble with sleep awakening.   Asked Dr. Lamount Pimple about this and excessive sleepiness. Taking alprazolam  0.25 mg TID and 0.5 mg HS.Aaron Aas They are wondering about OSA.   To bed 1230 and up 930.  Toss and turn.  Wife says he snores.  He plans to go back there if continues with this problem. Doing fine.  Joined gym 2 hours day and 5 days per week helping  mood. Not sure about depression.  Residual anxiety and would like to continue meds. No avoidance as far as he knows. Weight about 241#.  Questions about meds and weight gain. Plan: No med changes. Continue alprazolam  0.5 mg half 3 times daily and 1 nightly prn, risperidone  1 mg nightly, duloxetine  60 mg daily.  06/30/2021 appointment with the following noted: Usually alprazolam  0.25 mg am and 0.5 mg HS. Good overall.  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  Pleased. Anxiety mostly OK.  Occ bouts are less often.  Time to time obs but nowhere near as bad as it was. Noticed positional tremor resting.   No sleep study. Went off risperidone  for 10 days and starting having sleep problems so restarted it.   Not depressed.  Trouble staying asleep at times.  Might nap 2 days per week for 60-90 mins and other days 2 hour naps.  Then in bed 9 hours.  Doesn't think his sleep is that good.  May go to bed out of boredom. 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 D pregnant and due Dec with first grandchild. He stopped and restarted risperidone .   Plan: He doesn't want further med changes. Continue duloxetine  60, risperidone  1, alprazolam  prn.  03/30/2022 appointment noted: Sciatica on R and using crutches for 3 weeks.   Has been  to doctor and again on Monday. Dx PD May 2023. Dr Dotty Gee. Started Sinemet .  Not helped so far. Reduced risperidone  to 1/2 tablet at night and still on duloxetine  60 mg daily, still taking alprazolam  0.25 mg AM and 0.5 mg HS Didn't want to stop risperidone  bc helps with sleep some.  Was sleeping good before risperidone . Never completely stopped it. Anxiety and OCD is no worse than last time and relatively controlled for the last couple of years. Plan: Continue duloxetine  60 DC risperidone  DT dx PD and tremor For sleep trazodone  50 mg HS Ok to continue low dose alprazolam   11/24/22 appt noted: DX PD. Stopped risperidone   and not sure about tremor and anxiety is not  worse. Trying to come off Xanax .  Had read about BZ causing dementia.  Reduced to 1/2 Xanax  0.5 BID fior a few weeks and stopped 1-2 weeks ago. 2 and stopped. Don't sleep as well without it.  To bed 12 and up at 10AM.   Watches youtuve and reads in bed. Awakens 3 AM to urinate.   Still dealing with nausea for a week which has affected sleep.  Slept longer with Xanax . Wife concerns about his memory and sedentary life now that retired.  He is interested in things.  Forgot anniversary date.  Does not get lost driving.   Tremor in dominant hand.   Not as patient.  Anxiety through the roof driving to [place]. Psych meds: duloxetine  60 , trazodone  50 HS M dementia and uncle Lewey Body dementia. Not depressed.  No panic.   No problem with intrusive thoughts usually. ED for years. ED meds a little helpful but not much.  Tried injections which helped a while.  05/29/23 NS  06/29/23 appt noted: Psych meds: duloxetine  60 , trazodone  50 HS Had foot surgery and it's going well. Anxiety is "OK I guess" without marked change. Has worried abotu some of med saying it might cause hallucianations.  In particular PD.   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   Still dreaming but less with lower dose of trazodone .  Negative work dreams.     12/27/23 appt noted: Psych meds: duloxetine  60 , trazodone  50 HS PD tremor worse and some aches. Otherwise mentally doing fine.  No hallucinations from meds. Sleep is OK.   Anxiety is ok.   No SE.  Past Psychiatric Medication Trials: Sertraline diarrhea, fluvoxamine sedation, fluoxetine cognitive side effects, paroxetine side effects,  risperidone  1 mg,  Xanax  Duloxetine  60 mg since September 2019  Review of Systems:  Review of Systems  Constitutional:  Negative for unexpected weight change.  Cardiovascular:  Negative for palpitations.  Musculoskeletal:  Positive for back pain and gait problem.  Neurological:  Positive for tremors.  Negative for weakness.  Psychiatric/Behavioral:  Negative for agitation, behavioral problems, confusion, decreased concentration, dysphoric mood, hallucinations, self-injury, sleep disturbance and suicidal ideas. The patient is not nervous/anxious and is not hyperactive.     Medications: I have reviewed the patient's current medications.  Current Outpatient Medications  Medication Sig Dispense Refill   alfuzosin  (UROXATRAL ) 10 MG 24 hr tablet Take 10 mg by mouth daily with breakfast.     amLODipine  (NORVASC ) 10 MG tablet Take 10 mg by mouth daily.     aspirin -acetaminophen -caffeine (EXCEDRIN MIGRAINE) 250-250-65 MG tablet 2 tablets as needed for pain     atorvastatin (LIPITOR) 10 MG tablet SMARTSIG:1 Tablet(s) By Mouth Every Evening     baclofen  (LIORESAL ) 10 MG tablet Take  1 tablet (10 mg total) by mouth 3 (three) times daily. 30 each 0   carbidopa -levodopa  (SINEMET  IR) 25-100 MG tablet Take 1 tablet by mouth 3 (three) times daily. 90 tablet 5   Cyanocobalamin (VITAMIN B12) 1000 MCG TBCR 1 tablet Orally Once a day for 30 day(s)     finasteride (PROSCAR) 5 MG tablet Take 5 mg by mouth daily.     irbesartan (AVAPRO) 300 MG tablet Take 300 mg by mouth daily.     LAGEVRIO 200 MG CAPS capsule SMARTSIG:4 Capsule(s) By Mouth Every 12 Hours     metoprolol  tartrate (LOPRESSOR ) 50 MG tablet Take 50 mg by mouth 3 (three) times daily.     ondansetron  (ZOFRAN ) 4 MG tablet Take 1 tablet (4 mg total) by mouth every 8 (eight) hours as needed. 20 tablet 0   sildenafil (VIAGRA) 100 MG tablet Take 100 mg by mouth daily as needed for erectile dysfunction.     tadalafil (CIALIS) 5 MG tablet Take 5 mg by mouth daily as needed for erectile dysfunction.     trihexyphenidyl  (ARTANE ) 2 MG tablet Take 0.5 tablets (1 mg total) by mouth 3 (three) times daily with meals. 45 tablet 5   DULoxetine  (CYMBALTA ) 60 MG capsule Take 1 capsule (60 mg total) by mouth daily. 90 capsule 3   traZODone  (DESYREL ) 50 MG tablet Take 1  tablet (50 mg total) by mouth at bedtime. 90 tablet 3   No current facility-administered medications for this visit.    Medication Side Effects: None  Allergies:  Allergies  Allergen Reactions   Sulfa Antibiotics Hives    Past Medical History:  Diagnosis Date   Anxiety    Depression    ED (erectile dysfunction)    GERD (gastroesophageal reflux disease)    Headache    migraines   History of kidney stones    Hypertension    Insomnia    PVC (premature ventricular contraction)     Family History  Problem Relation Age of Onset   Dementia Mother    Ulcers Mother    Heart attack Father    Dementia Maternal Aunt    Dementia Paternal Uncle     Social History   Socioeconomic History   Marital status: Married    Spouse name: Not on file   Number of children: Not on file   Years of education: Not on file   Highest education level: Not on file  Occupational History   Not on file  Tobacco Use   Smoking status: Never   Smokeless tobacco: Never  Vaping Use   Vaping status: Never Used  Substance and Sexual Activity   Alcohol use: No   Drug use: No   Sexual activity: Not Currently  Other Topics Concern   Not on file  Social History Narrative   Left handed   Drinks caffeine   Lives with wife sherry   One floor home   unemployed   Social Drivers of Corporate investment banker Strain: Not on file  Food Insecurity: Not on file  Transportation Needs: Not on file  Physical Activity: Not on file  Stress: Not on file  Social Connections: Not on file  Intimate Partner Violence: Not on file    Past Medical History, Surgical history, Social history, and Family history were reviewed and updated as appropriate.   Please see review of systems for further details on the patient's review from today.   Objective:   Physical Exam:  There were no  vitals taken for this visit.  Physical Exam Constitutional:      General: He is not in acute distress.    Appearance:  Normal appearance. He is well-developed.  Musculoskeletal:        General: No deformity.  Neurological:     Mental Status: He is alert and oriented to person, place, and time.     Motor: Tremor present.     Coordination: Coordination normal.     Gait: Gait normal.  Psychiatric:        Attention and Perception: Attention and perception normal.        Mood and Affect: Mood is not anxious or depressed. Affect is not labile, blunt or inappropriate.        Speech: Speech is not delayed.        Behavior: Behavior is not slowed.        Thought Content: Thought content normal. Thought content is not delusional. Thought content does not include homicidal or suicidal ideation. Thought content does not include suicidal plan.        Cognition and Memory: Cognition normal.        Judgment: Judgment normal.     Comments: Insight intact. No auditory or visual hallucinations. No delusions. Chronic scrupulosity obsessions but under good control      Lab Review:     Component Value Date/Time   NA 134 (L) 11/14/2021 1455   K 3.8 11/14/2021 1455   CL 100 11/14/2021 1455   CO2 26 11/14/2021 1455   GLUCOSE 139 (H) 11/14/2021 1455   BUN 14 11/14/2021 1455   CREATININE 1.12 11/14/2021 1455   CALCIUM 8.5 (L) 11/14/2021 1455   PROT 7.2 03/08/2014 2005   ALBUMIN  3.9 03/08/2014 2005   AST 17 03/08/2014 2005   ALT 16 03/08/2014 2005   ALKPHOS 59 03/08/2014 2005   BILITOT 0.3 03/08/2014 2005   GFRNONAA >60 11/14/2021 1455   GFRAA >60 03/28/2017 0820       Component Value Date/Time   WBC 10.6 (H) 11/14/2021 1455   RBC 4.25 11/14/2021 1455   HGB 11.9 (L) 11/14/2021 1455   HCT 36.5 (L) 11/14/2021 1455   PLT 293 11/14/2021 1455   MCV 85.9 11/14/2021 1455   MCH 28.0 11/14/2021 1455   MCHC 32.6 11/14/2021 1455   RDW 13.2 11/14/2021 1455   LYMPHSABS 1.5 11/14/2021 1455   MONOABS 0.8 11/14/2021 1455   EOSABS 0.6 (H) 11/14/2021 1455   BASOSABS 0.1 11/14/2021 1455    No results found for:  "POCLITH", "LITHIUM"   No results found for: "PHENYTOIN", "PHENOBARB", "VALPROATE", "CBMZ"   .res Assessment: Plan:    Madden was seen today for follow-up.  Diagnoses and all orders for this visit:  Obsessive-compulsive disorder with good or fair insight -     DULoxetine  (CYMBALTA ) 60 MG capsule; Take 1 capsule (60 mg total) by mouth daily.  Social anxiety disorder -     DULoxetine  (CYMBALTA ) 60 MG capsule; Take 1 capsule (60 mg total) by mouth daily.  Insomnia due to mental condition -     traZODone  (DESYREL ) 50 MG tablet; Take 1 tablet (50 mg total) by mouth at bedtime.   Overall mood anxiety and sleep managed.  Medication sensitive.  Disc usual course and response of OCD, scrupulosity with meds and unlikely to fully resolve and need for chronic treatment.  Also usually need max dosages of SSRI but he's med sensitive. Importance of activity with retirement otherwise anxiety might be worse but he's done  OK with anxiety but limited activity..  Encourage activiity .  He stays busy with volunteering at church and other places.  Asked questions about PD including risk psychosis, SE meds.  And about sx.  Answered questions about PD and mental health.  He's still very concerned about it.  Also concerns about risk dementia.  Answered questions about this.    Disc importance activity to keep mental health.    Watch caffeine and sleep.  Sleep hygiene.  Sleep restriction.  Continue duloxetine  60.  Fear relapse if reduce it. For sleep trazodone  50 mg HS.  Option increase it. Ok to continue low dose alprazolam  if needed  Fu 6 mos  Nori Beat, MD, DFAPA    Please see After Visit Summary for patient specific instructions.  Future Appointments  Date Time Provider Department Center  01/03/2024  2:30 PM Janne Members R, DO [facility] None    No orders of the defined types were placed in this encounter.     -------------------------------

## 2024-01-02 NOTE — Progress Notes (Unsigned)
 NEUROLOGY FOLLOW UP OFFICE NOTE  [name] [id number]  Assessment/Plan:   Parkinson's disease.       Continue carbidopa -levodopa  25/100mg  three times daily Increase trihexyphenidyl  to 2mg  three times daily to reduce tremor Encourage routine exercise:  walking, light weights/resistance, consider yoga Consider weighted pens, utensils or bracelets Follow up in 6 months.       Subjective:  Bryan Sosa is a 68 year old left handed male with HTN and migraines who follows up for Parkinson's disease.     UPDATE: Current medication:  carbidopa  levodopa  25/100mg  1 tablet three times daily (7:30-8 AM, 5:30 PM, 11PM); trihexyphenidyl  1mg  three times daily.  To address tremor, he was started on trihexyphenidyl .  Hasn't noticed any significant improvement.    He and his wife notes some forgetfulness.  This includes word-finding difficulty.  Denies difficulty with remembering names.  Able to perform ADLs.  Once in awhile he may briefly become disoriented driving on familiar routes but able to recall quickly.    HISTORY: In late 2022, he started noticing tremor in his left hand.  It is difficult when he is brushing his teeth or holding utensils.  He started having slurred speech and difficulty speaking as well.  Some mild trouble swallowing.  He moves slower.  It takes a little more time to stand and straighten himself out of a chair.  However he denies freezing or gait instability.  No double vision or weakness.   He also reports short term memory problems.  It takes longer to collect his thoughts on what to say.  MRI of brain without contrast on 11/13/2021 showed nonspecific bilateral periventricular white matter T2/FLAIR foci increased compared to prior MRI from 2012.  Differential diagnosis on radiology report raised concern for demyelinating disease.  He reports that he has had poor sense of smell for many years.  Also, he has a history of having vivid dreams and sometimes acting out in  his sleep, suspicious for REM sleep behavior disorder.  It was noticeable after starting new medications, such as blood pressure medication, but not so much now.  He also endorses erectile dysfunction.  There is a family history of dementia in both parents and his uncle had Lewy Body Dementia.     09/23/2010 MRI BRAIN WO:  1.  Periventricular white matter changes.  These are nonspecific, but can be seen and myelination process such as multiple sclerosis.  Microvascular ischemic changes can give a similar appearance. Similar findings can be seen in the setting of chronic migraine headaches or with a vasculitis. 2.  No acute intracranial abnormality.  11/13/2021 MRI BRAIN WO:  1. Foci of FLAIR signal abnormality in the periventricular white matter bilaterally are increased in extent since 2012. These lesions are nonspecific, but the configuration raises suspicion for demyelination specifically multiple sclerosis. Differential includes sequela of chronic small vessel ischemia, vasculitis, among other etiologies.  2. Otherwise, no evidence of acute intracranial pathology.  PAST MEDICAL HISTORY: Past Medical History:  Diagnosis Date   Anxiety    Depression    ED (erectile dysfunction)    GERD (gastroesophageal reflux disease)    Headache    migraines   History of kidney stones    Hypertension    Insomnia    PVC (premature ventricular contraction)     MEDICATIONS: Current Outpatient Medications on File Prior to Visit  Medication Sig Dispense Refill   alfuzosin  (UROXATRAL ) 10 MG 24 hr tablet Take 10 mg by mouth daily with breakfast.  amLODipine  (NORVASC ) 10 MG tablet Take 10 mg by mouth daily.     aspirin -acetaminophen -caffeine (EXCEDRIN MIGRAINE) 250-250-65 MG tablet 2 tablets as needed for pain     atorvastatin (LIPITOR) 10 MG tablet SMARTSIG:1 Tablet(s) By Mouth Every Evening     baclofen  (LIORESAL ) 10 MG tablet Take 1 tablet (10 mg total) by mouth 3 (three) times daily. 30 each 0    carbidopa -levodopa  (SINEMET  IR) 25-100 MG tablet Take 1 tablet by mouth 3 (three) times daily. 90 tablet 5   Cyanocobalamin (VITAMIN B12) 1000 MCG TBCR 1 tablet Orally Once a day for 30 day(s)     DULoxetine  (CYMBALTA ) 60 MG capsule Take 1 capsule (60 mg total) by mouth daily. 90 capsule 3   finasteride (PROSCAR) 5 MG tablet Take 5 mg by mouth daily.     irbesartan (AVAPRO) 300 MG tablet Take 300 mg by mouth daily.     LAGEVRIO 200 MG CAPS capsule SMARTSIG:4 Capsule(s) By Mouth Every 12 Hours     metoprolol  tartrate (LOPRESSOR ) 50 MG tablet Take 50 mg by mouth 3 (three) times daily.     ondansetron  (ZOFRAN ) 4 MG tablet Take 1 tablet (4 mg total) by mouth every 8 (eight) hours as needed. 20 tablet 0   sildenafil (VIAGRA) 100 MG tablet Take 100 mg by mouth daily as needed for erectile dysfunction.     tadalafil (CIALIS) 5 MG tablet Take 5 mg by mouth daily as needed for erectile dysfunction.     traZODone  (DESYREL ) 50 MG tablet Take 1 tablet (50 mg total) by mouth at bedtime. 90 tablet 3   trihexyphenidyl  (ARTANE ) 2 MG tablet Take 0.5 tablets (1 mg total) by mouth 3 (three) times daily with meals. 45 tablet 5   No current facility-administered medications on file prior to visit.     ALLERGIES: Allergies  Allergen Reactions   Sulfa Antibiotics Hives    FAMILY HISTORY: Family History  Problem Relation Age of Onset   Dementia Mother    Ulcers Mother    Heart attack Father    Dementia Maternal Aunt    Dementia Paternal Uncle       Objective:  Blood pressure 119/77, pulse 70, height 6' (1.829 m), weight 257 lb (116.6 kg), SpO2 95%. General: No acute distress.  Patient appears well-groomed.   Head:  Normocephalic/atraumatic Eyes:  Fundi examined but not visualized Neck: supple, no paraspinal tenderness, full range of motion Heart:  Regular rate and rhythm Neurological Exam: alert and oriented.  Speech fluent and not dysarthric, language intact.  Hypomimia.  Hypophonic.  CN II-XII  intact.  Tone normal.  Muscle strength 5/5 throughout.  Reduced finger-thumb tapping speed and amplitude.  Otherwise, no bradykinesia.  Left resting tremor.  DTR 2+ throughout.  Finger to nose testing intact.  Gait with upright posture and slightly reduced left arm swing without shuffling.  Romberg negative.    Janne Members, DO  CC: Dorena Gander, MD

## 2024-01-03 ENCOUNTER — Other Ambulatory Visit: Payer: Self-pay | Admitting: Neurology

## 2024-01-03 ENCOUNTER — Encounter: Payer: Self-pay | Admitting: Neurology

## 2024-01-03 ENCOUNTER — Ambulatory Visit (INDEPENDENT_AMBULATORY_CARE_PROVIDER_SITE_OTHER): Payer: Medicare Other | Admitting: Neurology

## 2024-01-03 VITALS — BP 119/77 | HR 70 | Ht 72.0 in | Wt 257.0 lb

## 2024-01-03 DIAGNOSIS — G20A1 Parkinson's disease without dyskinesia, without mention of fluctuations: Secondary | ICD-10-CM

## 2024-01-03 MED ORDER — CARBIDOPA-LEVODOPA 25-100 MG PO TABS: 1.0000 | ORAL_TABLET | Freq: Three times a day (TID) | ORAL | 5 refills | Status: DC

## 2024-01-03 MED ORDER — TRIHEXYPHENIDYL HCL 2 MG PO TABS: 2.0000 mg | ORAL_TABLET | Freq: Three times a day (TID) | ORAL | 5 refills | Status: DC

## 2024-01-03 NOTE — Patient Instructions (Addendum)
 Continue carbidopa -levodopa  25/100mg  three times daily Increase trihexyphenidyl  to 2mg  three times daily Encourage routine exercise:  walking, light weights/resistance, consider yoga Consider weighted pens, utensils or bracelets Follow up in 6 months.

## 2024-02-21 ENCOUNTER — Other Ambulatory Visit: Payer: Self-pay | Admitting: Neurology

## 2024-04-01 DIAGNOSIS — Z860101 Personal history of adenomatous and serrated colon polyps: Secondary | ICD-10-CM | POA: Insufficient documentation

## 2024-04-01 DIAGNOSIS — K644 Residual hemorrhoidal skin tags: Secondary | ICD-10-CM | POA: Insufficient documentation

## 2024-06-18 ENCOUNTER — Other Ambulatory Visit: Payer: Self-pay | Admitting: Neurology

## 2024-07-01 ENCOUNTER — Encounter: Payer: Self-pay | Admitting: Radiology

## 2024-07-03 ENCOUNTER — Other Ambulatory Visit: Payer: Self-pay | Admitting: Family Medicine

## 2024-07-03 DIAGNOSIS — R198 Other specified symptoms and signs involving the digestive system and abdomen: Secondary | ICD-10-CM

## 2024-07-11 ENCOUNTER — Ambulatory Visit
Admission: RE | Admit: 2024-07-11 | Discharge: 2024-07-11 | Disposition: A | Discharge status: Home / Self Care | Source: Ambulatory Visit | Attending: Family Medicine | Admitting: Family Medicine

## 2024-07-11 DIAGNOSIS — S8001XA Contusion of right knee, initial encounter: Secondary | ICD-10-CM | POA: Insufficient documentation

## 2024-07-11 DIAGNOSIS — R198 Other specified symptoms and signs involving the digestive system and abdomen: Secondary | ICD-10-CM

## 2024-07-11 DIAGNOSIS — M25561 Pain in right knee: Secondary | ICD-10-CM | POA: Insufficient documentation

## 2024-07-11 MED ORDER — IOPAMIDOL (ISOVUE-300) INJECTION 61%
100.0000 mL | Freq: Once | INTRAVENOUS | Status: AC | PRN
  Administered 2024-07-11: 100 mL via INTRAVENOUS

## 2024-07-19 NOTE — Progress Notes (Unsigned)
 NEUROLOGY FOLLOW UP OFFICE NOTE  Bryan Sosa [id number]  Assessment/Plan:   Parkinson's disease.   Cognitive changes     [place] carbidopa -levodopa  25/100mg  to 1.5 tablets three times daily Continue trihexyphenidyl  2mg  three times daily to reduce tremor Encourage routine exercise:  walking, light weights/resistance, consider yoga Consider weighted pens, utensils or bracelets Will order neuropsychological evaluation Follow up in 7 months or sooner if needed       Subjective:  Bryan Sosa is a 68 year old left handed male with HTN and migraines who follows up for Parkinson's disease.  He is accompanied by his wife who supplements history.   UPDATE: Current medication:  carbidopa  levodopa  25/100mg  1 tablet three times daily (7:30-8 AM, 5:30 PM, 11PM); trihexyphenidyl  2mg  three times daily.  To address tremor, increased trihexyphenidyl  from 1mg  to 2mg  three times daily.  Hasn't noticed improvement.  In fact, he feels that the tremor has gotten worse.  He has gotten weighted bracelet but hasn't used it.  No freezing.  Has not been exercising well.    He and his wife notes some forgetfulness since at least 2024.  This includes word-finding difficulty.  Sometimes at bible study, will lose his train of thought.  Repeats questions.  Sometimes takes some time to process what other people are telling him.  Denies difficulty with remembering names.  Able to perform ADLs.  Once in awhile he may briefly become disoriented driving on familiar routes but able to recall quickly.  He and his wife sleep in separate rooms.  Sometimes he thinks he is sleeping in bed with his wife or sometimes thinks he and his wife have done something that didn't happen.  HISTORY: In late 2022, he started noticing tremor in his left hand.  It is difficult when he is brushing his teeth or holding utensils.  He started having slurred speech and difficulty speaking as well.  Some mild trouble swallowing.  He moves  slower.  It takes a little more time to stand and straighten himself out of a chair.  However he denies freezing or gait instability.  No double vision or weakness.   He also reports short term memory problems.  It takes longer to collect his thoughts on what to say.  Endorses word-finding difficulty.  MRI of brain without contrast on 11/13/2021 showed nonspecific bilateral periventricular white matter T2/FLAIR foci increased compared to prior MRI from 2012.  Differential diagnosis on radiology report raised concern for demyelinating disease.  He reports that he has had poor sense of smell for many years.  Also, he has a history of having vivid dreams and sometimes acting out in his sleep, suspicious for REM sleep behavior disorder.  It was noticeable after starting new medications, such as blood pressure medication, but not so much now.  He also endorses erectile dysfunction.  There is a family history of dementia in both parents and his uncle had Lewy Body Dementia.     09/23/2010 MRI BRAIN WO:  1.  Periventricular white matter changes.  These are nonspecific, but can be seen and myelination process such as multiple sclerosis.  Microvascular ischemic changes can give a similar appearance. Similar findings can be seen in the setting of chronic migraine headaches or with a vasculitis. 2.  No acute intracranial abnormality.  11/13/2021 MRI BRAIN WO:  1. Foci of FLAIR signal abnormality in the periventricular white matter bilaterally are increased in extent since 2012. These lesions are nonspecific, but the configuration raises suspicion for  demyelination specifically multiple sclerosis. Differential includes sequela of chronic small vessel ischemia, vasculitis, among other etiologies.  2. Otherwise, no evidence of acute intracranial pathology.  PAST MEDICAL HISTORY: Past Medical History:  Diagnosis Date   Anxiety    Depression    ED (erectile dysfunction)    GERD (gastroesophageal reflux disease)     Headache    migraines   History of kidney stones    Hypertension    Insomnia    PVC (premature ventricular contraction)     MEDICATIONS: Current Outpatient Medications on File Prior to Visit  Medication Sig Dispense Refill   alfuzosin  (UROXATRAL ) 10 MG 24 hr tablet Take 10 mg by mouth daily with breakfast.     amLODipine  (NORVASC ) 10 MG tablet Take 10 mg by mouth daily.     aspirin -acetaminophen -caffeine (EXCEDRIN MIGRAINE) 250-250-65 MG tablet 2 tablets as needed for pain     atorvastatin (LIPITOR) 10 MG tablet SMARTSIG:1 Tablet(s) By Mouth Every Evening     baclofen  (LIORESAL ) 10 MG tablet Take 1 tablet (10 mg total) by mouth 3 (three) times daily. 30 each 0   carbidopa -levodopa  (SINEMET  IR) 25-100 MG tablet TAKE 1 TABLET BY MOUTH 3 TIMES DAILY 90 tablet 1   Cyanocobalamin (VITAMIN B12) 1000 MCG TBCR 1 tablet Orally Once a day for 30 day(s)     DULoxetine  (CYMBALTA ) 60 MG capsule Take 1 capsule (60 mg total) by mouth daily. 90 capsule 3   finasteride (PROSCAR) 5 MG tablet Take 5 mg by mouth daily.     irbesartan (AVAPRO) 300 MG tablet Take 300 mg by mouth daily.     LAGEVRIO 200 MG CAPS capsule SMARTSIG:4 Capsule(s) By Mouth Every 12 Hours     metoprolol  tartrate (LOPRESSOR ) 50 MG tablet Take 50 mg by mouth 3 (three) times daily.     ondansetron  (ZOFRAN ) 4 MG tablet Take 1 tablet (4 mg total) by mouth every 8 (eight) hours as needed. 20 tablet 0   sildenafil (VIAGRA) 100 MG tablet Take 100 mg by mouth daily as needed for erectile dysfunction.     tadalafil (CIALIS) 5 MG tablet Take 5 mg by mouth daily as needed for erectile dysfunction.     traZODone  (DESYREL ) 50 MG tablet Take 1 tablet (50 mg total) by mouth at bedtime. 90 tablet 3   trihexyphenidyl  (ARTANE ) 2 MG tablet Take 1 tablet (2 mg total) by mouth 3 (three) times daily with meals. 90 tablet 5   No current facility-administered medications on file prior to visit.     ALLERGIES: Allergies  Allergen Reactions   Sulfa  Antibiotics Hives    FAMILY HISTORY: Family History  Problem Relation Age of Onset   Dementia Mother    Ulcers Mother    Heart attack Father    Dementia Maternal Aunt    Dementia Paternal Uncle       Objective:  Blood pressure 119/77, pulse 70, height 6' (1.829 m), weight 257 lb (116.6 kg), SpO2 95%. General: No acute distress.  Patient appears well-groomed.   Head:  Normocephalic/atraumatic Eyes:  Fundi examined but not visualized Neck: supple, no paraspinal tenderness, full range of motion Heart:  Regular rate and rhythm Neurological Exam: Alert and oriented.  Speech fluent and not dysarthric.  Language intact.      07/22/2024    3:00 PM  Montreal Cognitive Assessment   Visuospatial/ Executive (0/5) 2  Naming (0/3) 3  Attention: Read list of digits (0/2) 2  Attention: Read list of letters (0/1) 1  Attention: Serial 7 subtraction starting at 100 (0/3) 2  Language: Repeat phrase (0/2) 2  Language : Fluency (0/1) 1  Abstraction (0/2) 0  Delayed Recall (0/5) 4  Orientation (0/6) 6  Total 23  Adjusted Score (based on education) 23   Hypomimia.  Hypophonic.  CN II-XII intact.  Tone normal.  Muscle strength 5/5 throughout.  Reduced finger-thumb tapping speed and amplitude.  Otherwise, no bradykinesia.  Left resting tremor.  DTRs 2+ throughout.  Finger to nose testing intact.  Gait with upright posture and slightly reduced left arm swing without shuffling.  Romberg negative.  Juliene Dunnings, DO  CC: Vernell Fort, MD

## 2024-07-22 ENCOUNTER — Encounter: Payer: Self-pay | Admitting: Neurology

## 2024-07-22 ENCOUNTER — Ambulatory Visit (INDEPENDENT_AMBULATORY_CARE_PROVIDER_SITE_OTHER): Admitting: Neurology

## 2024-07-22 VITALS — BP 118/72 | HR 77 | Ht 72.0 in | Wt 274.0 lb

## 2024-07-22 DIAGNOSIS — G20A1 Parkinson's disease without dyskinesia, without mention of fluctuations: Secondary | ICD-10-CM | POA: Diagnosis not present

## 2024-07-22 DIAGNOSIS — R4189 Other symptoms and signs involving cognitive functions and awareness: Secondary | ICD-10-CM | POA: Diagnosis not present

## 2024-07-22 MED ORDER — CARBIDOPA-LEVODOPA 25-100 MG PO TABS: 1.5000 | ORAL_TABLET | Freq: Three times a day (TID) | ORAL | 5 refills | Status: AC

## 2024-07-22 MED ORDER — TRIHEXYPHENIDYL HCL 2 MG PO TABS: 2.0000 mg | ORAL_TABLET | Freq: Three times a day (TID) | ORAL | 5 refills | Status: AC

## 2024-07-22 NOTE — Patient Instructions (Signed)
 Increase carbidopa -levodopa  to 1 1/2 tablets three times daily Continue trihexyphenidyl  2mg  three times daily Exercise Neurocognitive testing Follow up in 7 months (sooner pending cognitive testing results)

## 2024-07-26 ENCOUNTER — Encounter (HOSPITAL_BASED_OUTPATIENT_CLINIC_OR_DEPARTMENT_OTHER): Payer: Self-pay

## 2024-07-26 ENCOUNTER — Emergency Department (HOSPITAL_BASED_OUTPATIENT_CLINIC_OR_DEPARTMENT_OTHER)

## 2024-07-26 ENCOUNTER — Emergency Department (HOSPITAL_BASED_OUTPATIENT_CLINIC_OR_DEPARTMENT_OTHER)
Admission: EM | Admit: 2024-07-26 | Discharge: 2024-07-26 | Disposition: A | Discharge status: Home / Self Care | Attending: Emergency Medicine | Admitting: Emergency Medicine

## 2024-07-26 ENCOUNTER — Other Ambulatory Visit: Payer: Self-pay

## 2024-07-26 DIAGNOSIS — X501XXA Overexertion from prolonged static or awkward postures, initial encounter: Secondary | ICD-10-CM | POA: Diagnosis not present

## 2024-07-26 DIAGNOSIS — Z7982 Long term (current) use of aspirin: Secondary | ICD-10-CM | POA: Insufficient documentation

## 2024-07-26 DIAGNOSIS — S8391XA Sprain of unspecified site of right knee, initial encounter: Secondary | ICD-10-CM | POA: Insufficient documentation

## 2024-07-26 DIAGNOSIS — M25561 Pain in right knee: Secondary | ICD-10-CM | POA: Diagnosis present

## 2024-07-26 NOTE — ED Provider Notes (Signed)
 [facility] Provider Note   CSN: [id number] Arrival date & time: 07/26/24  1144     Patient presents with: Fall and Knee Pain   Bryan Sosa is a 68 y.o. male.    Fall  Knee Pain Patient presents with fall and right knee pain.  Around a week ago had a fall where he tripped fell and landed with his knees.  Has had pain since.  Went to see [facility] urgent care.  Reportedly had x-ray and was given knee sleeve.  States he was walking with the sleeve today and felt a pop in the knee.  States it twisted.  States it is felt as if something was out of place.  No new injury.     Prior to Admission medications   Medication Sig Start Date End Date Taking? Authorizing Provider  alfuzosin  (UROXATRAL ) 10 MG 24 hr tablet Take 10 mg by mouth daily with breakfast.    [provider]  amLODipine  (NORVASC ) 10 MG tablet Take 10 mg by mouth daily.    [provider]  aspirin -acetaminophen -caffeine (EXCEDRIN MIGRAINE) 250-250-65 MG tablet 2 tablets as needed for pain    [provider]  atorvastatin (LIPITOR) 10 MG tablet SMARTSIG:1 Tablet(s) By Mouth Every Evening 03/07/22   [provider]  baclofen  (LIORESAL ) 10 MG tablet Take 1 tablet (10 mg total) by mouth 3 (three) times daily. 04/08/22   Williams, Megan E, NP  carbidopa -levodopa  (SINEMET  IR) 25-100 MG tablet Take 1.5 tablets by mouth 3 (three) times daily. 07/22/24   Skeet Juliene SAUNDERS, DO  Cyanocobalamin (VITAMIN B12) 1000 MCG TBCR 1 tablet Orally Once a day for 30 day(s) 03/24/23   [provider]  DULoxetine  (CYMBALTA ) 60 MG capsule Take 1 capsule (60 mg total) by mouth daily. 12/27/23   Cottle, Lorene KANDICE Raddle., MD  finasteride (PROSCAR) 5 MG tablet Take 5 mg by mouth daily. 11/27/21   [provider]  irbesartan (AVAPRO) 300 MG tablet Take 300 mg by mouth daily. 07/24/19   [provider]  LAGEVRIO 200 MG CAPS capsule SMARTSIG:4 Capsule(s) By  Mouth Every 12 Hours 09/07/22   [provider]  metoprolol  tartrate (LOPRESSOR ) 50 MG tablet Take 50 mg by mouth 3 (three) times daily.    [provider]  ondansetron  (ZOFRAN ) 4 MG tablet Take 1 tablet (4 mg total) by mouth every 8 (eight) hours as needed. 06/08/23   Hyatt, Max T, DPM  sildenafil (VIAGRA) 100 MG tablet Take 100 mg by mouth daily as needed for erectile dysfunction.    [provider]  tadalafil (CIALIS) 5 MG tablet Take 5 mg by mouth daily as needed for erectile dysfunction.    [provider]  traZODone  (DESYREL ) 50 MG tablet Take 1 tablet (50 mg total) by mouth at bedtime. 12/27/23   Cottle, Lorene KANDICE Raddle., MD  trihexyphenidyl  (ARTANE ) 2 MG tablet Take 1 tablet (2 mg total) by mouth 3 (three) times daily with meals. 07/22/24   Skeet Juliene SAUNDERS, DO    Allergies: Sulfa antibiotics    Review of Systems  Updated Vital Signs BP 125/83 (BP Location: Left Arm)   Pulse 76   Temp 98.1 F (36.7 C) (Oral)   Resp 17   SpO2 99%   Physical Exam Vitals and nursing note reviewed.  Musculoskeletal:     Comments: Some tenderness laterally and medially on the knee.  No large effusion.  Good range of motion.  No  tenderness over patella.  Neurological:     Mental Status: He is alert.     (all labs ordered are listed, but only abnormal results are displayed) Labs Reviewed - No data to display  EKG: None  Radiology: DG Knee Complete 4 Views Right Result Date: 07/26/2024 CLINICAL DATA:  Fall 1 week ago.  Bilateral knee pain. EXAM: RIGHT KNEE - COMPLETE 4+ VIEW COMPARISON:  None Available. FINDINGS: No fracture or bone lesion. Knee joint is normally spaced and aligned. No degenerative/arthropathic changes. Trace joint effusion. Mild subcutaneous soft tissue edema. IMPRESSION: 1. No fracture or acute skeletal abnormality. Electronically Signed   By: Alm Parkins M.D.   On: 07/26/2024 13:51     Procedures   Medications Ordered in the ED - No data to  display                                  Medical Decision Making Amount and/or Complexity of Data Reviewed Radiology: ordered.   Patient with knee pain.  Initially had a fall now felt a popping in the knee.  Had hinged knee brace on place.  X-ray done and reassuring.  Differential diagnosis does include internal derangement.  However no large effusion.  Will give knee immobilizer.  Will have follow-up with [facility], who is seen prior.  Will discharge home.     Final diagnoses:  Sprain of right knee, unspecified ligament, initial encounter    ED Discharge Orders     None          Patsey Lot, MD 07/26/24 1411

## 2024-07-26 NOTE — ED Triage Notes (Signed)
 Reports a mechanical fall X 1 week. Reports bilateral knee pain. Was seen by sports medicine. Reports right knee pain worsening today.

## 2024-07-26 NOTE — ED Notes (Signed)
Discharge instructions reviewed with patient. Patient questions answered and opportunity for education reviewed. Patient voices understanding of discharge instructions with no further questions. Patient to lobby via wheelchair. 

## 2024-07-31 ENCOUNTER — Encounter: Payer: Self-pay | Admitting: Psychology

## 2024-07-31 DIAGNOSIS — N320 Bladder-neck obstruction: Secondary | ICD-10-CM | POA: Insufficient documentation

## 2024-07-31 DIAGNOSIS — R7303 Prediabetes: Secondary | ICD-10-CM | POA: Insufficient documentation

## 2024-07-31 DIAGNOSIS — M5430 Sciatica, unspecified side: Secondary | ICD-10-CM | POA: Insufficient documentation

## 2024-07-31 DIAGNOSIS — I493 Ventricular premature depolarization: Secondary | ICD-10-CM | POA: Insufficient documentation

## 2024-07-31 DIAGNOSIS — G20A1 Parkinson's disease without dyskinesia, without mention of fluctuations: Secondary | ICD-10-CM | POA: Insufficient documentation

## 2024-07-31 DIAGNOSIS — L659 Nonscarring hair loss, unspecified: Secondary | ICD-10-CM | POA: Insufficient documentation

## 2024-07-31 DIAGNOSIS — K219 Gastro-esophageal reflux disease without esophagitis: Secondary | ICD-10-CM | POA: Insufficient documentation

## 2024-07-31 DIAGNOSIS — M7702 Medial epicondylitis, left elbow: Secondary | ICD-10-CM | POA: Insufficient documentation

## 2024-07-31 DIAGNOSIS — E7800 Pure hypercholesterolemia, unspecified: Secondary | ICD-10-CM | POA: Insufficient documentation

## 2024-07-31 DIAGNOSIS — R319 Hematuria, unspecified: Secondary | ICD-10-CM | POA: Insufficient documentation

## 2024-07-31 DIAGNOSIS — F900 Attention-deficit hyperactivity disorder, predominantly inattentive type: Secondary | ICD-10-CM | POA: Insufficient documentation

## 2024-07-31 DIAGNOSIS — R972 Elevated prostate specific antigen [PSA]: Secondary | ICD-10-CM | POA: Insufficient documentation

## 2024-07-31 DIAGNOSIS — R440 Auditory hallucinations: Secondary | ICD-10-CM | POA: Insufficient documentation

## 2024-07-31 DIAGNOSIS — R002 Palpitations: Secondary | ICD-10-CM | POA: Insufficient documentation

## 2024-07-31 DIAGNOSIS — R400 Somnolence: Secondary | ICD-10-CM | POA: Insufficient documentation

## 2024-07-31 DIAGNOSIS — N529 Male erectile dysfunction, unspecified: Secondary | ICD-10-CM | POA: Insufficient documentation

## 2024-07-31 DIAGNOSIS — K5730 Diverticulosis of large intestine without perforation or abscess without bleeding: Secondary | ICD-10-CM | POA: Insufficient documentation

## 2024-08-01 ENCOUNTER — Ambulatory Visit: Admitting: Psychology

## 2024-08-01 ENCOUNTER — Ambulatory Visit

## 2024-08-01 ENCOUNTER — Encounter: Payer: Self-pay | Admitting: Psychology

## 2024-08-01 DIAGNOSIS — R4189 Other symptoms and signs involving cognitive functions and awareness: Secondary | ICD-10-CM

## 2024-08-01 DIAGNOSIS — F0670 Mild neurocognitive disorder due to known physiological condition without behavioral disturbance: Secondary | ICD-10-CM | POA: Insufficient documentation

## 2024-08-01 NOTE — Progress Notes (Signed)
 NEUROPSYCHOLOGICAL EVALUATION [facility] [place] Department of Neurology  Date of Evaluation: August 01, 2024  Reason for Referral:   Bryan Sosa is a 68 y.o. left-handed Caucasian male referred by Juliene Dunnings, D.O., to characterize his current cognitive functioning and assist with diagnostic clarity and treatment planning in the context of parkinsonian features and concerns for progressive cognitive decline.   Assessment and Plan:   Clinical Impression(s): Bryan Sosa pattern of performance is suggestive of an isolated impairment across phonemic fluency, as well as performance variability surrounding processing speed, visuospatial abilities, and both encoding (i.e., learning) and recognition/consolidation aspects of memory. Performances were appropriate relative to age-matched peers across attention/concentration, executive functioning, receptive language, semantic fluency, confrontation naming, and delayed retrieval aspects of memory. Functionally, Bryan Sosa denied difficulties completing instrumental activities of daily living (ADLs) independently. As such, given evidence for cognitive dysfunction described above, he meets criteria for a Mild Neurocognitive Disorder (mild cognitive impairment) at the present time.  Bryan Sosa pattern of weakness across cognitive testing is nonspecific in nature. There have been previous concerns surrounding Parkinson's disease within his medical records given tremors, bradykinesia, dysphagia, and some balance instability. Bryan Sosa nonspecific pattern of weakness could be reasonably seen in this condition. Cognitive testing is not sufficient to diagnose Parkinson's disease in isolation and only represents a supplementary feature. His reporting does not suggest hallucinations or a REM sleep behavior in a compelling fashion. This, combined with his cognitive profile, makes Lewy body disease less likely relative to idiopathic  Parkinson's disease in my opinion. Memory patterns are not worrisome for symptomatic Alzheimer's disease at the present time.   Outside of Parkinson's disease concerns, the neuroradiologist reading his recent MRI expressed some concern surrounding a demyelinating condition given the emergence of a new rounded lesion within the right centrum semiovale relative to a previous scan. Nonspecific testing patterns can also be seen in these conditions (e.g., multiple sclerosis). This will be important to monitor over time; however, behavioral symptoms may favor a parkinsonian presentation. Finally, Bryan Sosa further reported longstanding attentional dysregulation and wondered about underlying ADHD. He described symptoms predating the age of 44 and impacting at least one life domain (academic), which would satisfy some diagnostic criteria for this condition. Him having at least traits of this condition appears a reasonable conceptualization. Continued medical monitoring will be important moving forward.   Recommendations: A DaTscan or alpha synuclein skin biopsy could be considered if there is a desire/need to more firmly rule in or out an underlying parkinsonian presentation.  Should there be concern for progressive cognitive decline in the future, a repeat neuropsychological evaluation could be considered at that time. This should take place no sooner than 12 months from the current date.  Performance across neurocognitive testing is not a strong predictor of an individual's safety operating a motor vehicle. Should he or his family wish to pursue a formalized driving evaluation, they could reach out to the following agencies: [facility] [employer] [facility] in [place]: [phone] [facility]: [phone] [facility]: [phone] [facility]: [phone] or [phone]  Should there be progression of current deficits over time, Bryan Sosa is unlikely to regain any  independent living skills lost. Therefore, it is recommended that he remain as involved as possible in all aspects of household chores, finances, and medication management, with supervision to ensure adequate performance. He will likely benefit from the establishment and maintenance of a routine in order to maximize his functional abilities over time.  It  may be beneficial for Bryan Sosa to have another person with him when in situations where he may need to process information, weigh the pros and cons of different options, and make decisions, in order to ensure that he fully understands and recalls all information to be considered.  Bryan Sosa is encouraged to attend to lifestyle factors for brain health (e.g., regular physical exercise, good nutrition habits and consideration of the MIND-DASH diet, regular participation in cognitively-stimulating activities, and general stress management techniques), which are likely to have benefits for both emotional adjustment and cognition. In fact, in addition to promoting good general health, regular exercise incorporating aerobic activities (e.g., brisk walking, jogging, cycling, etc.) has been demonstrated to be a very effective treatment for depression and stress, with similar efficacy rates to both antidepressant medication and psychotherapy. Optimal control of vascular risk factors (including safe cardiovascular exercise and adherence to dietary recommendations) is encouraged. Continued participation in activities which provide mental stimulation and social interaction is also recommended.   Memory can be improved using internal strategies such as rehearsal, repetition, chunking, mnemonics, association, and imagery. External strategies such as written notes in a consistently used memory journal, visual and nonverbal auditory cues such as a calendar on the refrigerator or appointments with alarm, such as on a cell phone, can also help maximize recall.    When  learning new information, he would benefit from information being broken up into small, manageable pieces. He may also find it helpful to articulate the material in his own words and in a context to promote encoding at the onset of a new task. This material may need to be repeated multiple times to promote encoding.  To address problems with processing speed, he may wish to consider:   -Ensuring that he is alerted when essential material or instructions are being presented   -Adjusting the speed at which new information is presented   -Allowing for more time in comprehending, processing, and responding in conversation   -Repeating and paraphrasing instructions or conversations aloud  To address problems with fluctuating attention and/or executive dysfunction, he may wish to consider:   -Avoiding external distractions when needing to concentrate   -Limiting exposure to fast paced environments with multiple sensory demands   -Writing down complicated information and using checklists   -Attempting and completing one task at a time (i.e., no multi-tasking)   -Verbalizing aloud each step of a task to maintain focus   -Taking frequent breaks during the completion of steps/tasks to avoid fatigue   -Reducing the amount of information considered at one time   -Scheduling more difficult activities for a time of day where he is usually most alert  Review of Records:   Past Medical History:  Diagnosis Date   Alopecia    Arthralgia of right knee 07/11/2024   Bladder neck obstruction 07/31/2024   BMI 34.0-34.9,adult 05/07/2015   BPH (benign prostatic hyperplasia) 10/13/2021   Contusion of right knee 07/11/2024   Daytime somnolence    Depression    Diverticulosis of colon    Dysfunction of left eustachian tube 01/08/2021   Elevated PSA    Erectile dysfunction    Essential hypertension 05/07/2015   External hemorrhoids 04/01/2024   Gastro-esophageal reflux disease without esophagitis    Headache     migraines   Hematuria    History of adenomatous polyp of colon 04/01/2024   History of kidney stones    Insomnia    Medial epicondylitis of left elbow 07/31/2024   Mixed  conductive and sensorineural hearing loss of left ear with restricted hearing of right ear 12/17/2020   Palpitations 07/31/2024   Parkinson's disease    Prediabetes    Pure hypercholesterolemia    PVC (premature ventricular contraction)    Sciatica    Social anxiety disorder 08/08/2018    Past Surgical History:  Procedure Laterality Date   bladder neck obstruction     2008   XI ROBOTIC ASSISTED SIMPLE PROSTATECTOMY N/A 10/13/2021   Procedure: XI ROBOTIC ASSISTED SIMPLE PROSTATECTOMY;  Surgeon: Alvaro Hummer, MD;  Location: [facility];  Service: Urology;  Laterality: N/A;    Current Outpatient Medications:    alfuzosin  (UROXATRAL ) 10 MG 24 hr tablet, Take 10 mg by mouth daily with breakfast., Disp: , Rfl:    amLODipine  (NORVASC ) 10 MG tablet, Take 10 mg by mouth daily., Disp: , Rfl:    aspirin -acetaminophen -caffeine (EXCEDRIN MIGRAINE) 250-250-65 MG tablet, 2 tablets as needed for pain, Disp: , Rfl:    atorvastatin (LIPITOR) 10 MG tablet, SMARTSIG:1 Tablet(s) By Mouth Every Evening, Disp: , Rfl:    baclofen  (LIORESAL ) 10 MG tablet, Take 1 tablet (10 mg total) by mouth 3 (three) times daily., Disp: 30 each, Rfl: 0   carbidopa -levodopa  (SINEMET  IR) 25-100 MG tablet, Take 1.5 tablets by mouth 3 (three) times daily., Disp: 135 tablet, Rfl: 5   Cyanocobalamin (VITAMIN B12) 1000 MCG TBCR, 1 tablet Orally Once a day for 30 day(s), Disp: , Rfl:    DULoxetine  (CYMBALTA ) 60 MG capsule, Take 1 capsule (60 mg total) by mouth daily., Disp: 90 capsule, Rfl: 3   finasteride (PROSCAR) 5 MG tablet, Take 5 mg by mouth daily., Disp: , Rfl:    irbesartan (AVAPRO) 300 MG tablet, Take 300 mg by mouth daily., Disp: , Rfl:    LAGEVRIO 200 MG CAPS capsule, SMARTSIG:4 Capsule(s) By Mouth Every 12 Hours, Disp: , Rfl:    metoprolol  tartrate  (LOPRESSOR ) 50 MG tablet, Take 50 mg by mouth 3 (three) times daily., Disp: , Rfl:    ondansetron  (ZOFRAN ) 4 MG tablet, Take 1 tablet (4 mg total) by mouth every 8 (eight) hours as needed., Disp: 20 tablet, Rfl: 0   sildenafil (VIAGRA) 100 MG tablet, Take 100 mg by mouth daily as needed for erectile dysfunction., Disp: , Rfl:    tadalafil (CIALIS) 5 MG tablet, Take 5 mg by mouth daily as needed for erectile dysfunction., Disp: , Rfl:    traZODone  (DESYREL ) 50 MG tablet, Take 1 tablet (50 mg total) by mouth at bedtime., Disp: 90 tablet, Rfl: 3   trihexyphenidyl  (ARTANE ) 2 MG tablet, Take 1 tablet (2 mg total) by mouth 3 (three) times daily with meals., Disp: 90 tablet, Rfl: 5     07/22/2024    3:00 PM  Montreal Cognitive Assessment   Visuospatial/ Executive (0/5) 2  Naming (0/3) 3  Attention: Read list of digits (0/2) 2  Attention: Read list of letters (0/1) 1  Attention: Serial 7 subtraction starting at 100 (0/3) 2  Language: Repeat phrase (0/2) 2  Language : Fluency (0/1) 1  Abstraction (0/2) 0  Delayed Recall (0/5) 4  Orientation (0/6) 6  Total 23  Adjusted Score (based on education) 23   Neuroimaging: Brain MRI on 09/23/2010 suggested scattered periventricular white matter changes of unspecified severity but was otherwise unremarkable. Head CT on 03/28/2017 in the context of dizziness was negative. Brain MRI on 11/13/2021 again suggested periventricular white matter changes of unspecified severity. There was a comment suggesting some progression relative to  his 2012 scan, as well as a new rounded lesion within the right centrum semiovale.   Clinical Interview:   The following information was obtained during a clinical interview with Bryan Sosa prior to cognitive testing.  Cognitive Symptoms: Decreased short-term memory: Endorsed. He reported some memory loss, largely surrounding him losing his train of thought while speaking. He provided an example of forgetting his intended comment  or question while waiting his turn to speak during Bible study sessions. He also noted that his wife has expressed some concern surrounding increased repetition in his day-to-day life. Difficulties were said to be observable for the past year or so and seem to come and go rather than be a constant presence.  Decreased long-term memory: Denied. Decreased attention/concentration: Endorsed. He reported longstanding difficulties with sustained focus/attention and distractibility dating back to early childhood and adolescence. He was never formally evaluated or diagnosed with ADHD; however, he did wonder about this condition and noted that his daughter has been formally diagnosed. Attentional difficulties impacted at least one life domain (academic) and have persisted throughout his life.  Reduced processing speed: Endorsed. Difficulties with executive functions: Endorsed. He noted longstanding difficulties surrounding decision making, organization, and multi-tasking, attributing much of this to underlying attentional dysregulation. He did not report prominent trouble surrounding impulsivity. No prominent personality changes were noted.  Difficulties with emotion regulation: Denied. Difficulties with receptive language: Denied. Difficulties with word finding: Denied. Decreased visuoperceptual ability: Denied.  Difficulties completing ADLs: Denied. He did comment deferring to his wife more when driving. He described backseat driving tendencies which can increase stress, noting that allowing her to drive more consistently is a way to avoid this stress. He did not provide examples surrounding his wife's driving-related concerns.   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 He denied to his knowledge any impacts causing a loss in consciousness  or causing persisting symptoms. No recent head impacts were reported.  History of stroke: Denied. History of seizure activity: Denied. History of known exposure to toxins: Denied. Symptoms of chronic pain: He reported walking on wet grass/leaves and ultimately slipping and falling the previous month. He was seen in the ED on 07/26/2024 and diagnosed with a right knee sprain. He continues to deal with some acute discomfort stemming from this event. Outside of this, no more chronic pain experiences were described.  Experience of frequent headaches/migraines: Endorsed. He reported experiencing dull headache symptoms 2-3 times per week.  Frequent instances of dizziness/vertigo: Denied.  Sensory changes: He utilizes hearing aids with some benefit. Other sensory changes/difficulties were denied.  Balance/coordination difficulties: Denied. Other motor difficulties: He reported first observing a mild resting tremor in his left hand in 2022. Over time, he also described onset of occasional slurred speech, bradykinesia, greater difficulty arising from a seated position, and some swallowing difficulties. As such, concern has been raised surrounding an underlying parkinsonian condition. Currently, tremors were said to largely remain in his left hand/arm. He noted that he is able to stop tremors for a few minutes with intentional focus. Current medications were said to be mildly helpful.   Sleep History: Estimated hours obtained each night: Unclear.  Difficulties falling asleep: Denied. Difficulties staying asleep: Endorsed. He reported waking several time throughout the night, sometimes to use the restroom and sometimes due to unknown reasons.  Feels rested and refreshed upon awakening: Variably so depending in  the quantity and quality of sleep obtained the night before.   History of snoring: Endorsed. History of waking up gasping for air: Denied. Witnessed breath cessation while asleep: Denied. He reported  completing a remote sleep study which was unremarkable.   History of vivid dreaming: Endorsed. Excessive movement while asleep: Endorsed. He did report instances where he will wake and his bed covers will appear thrown around like he was thrashing in his sleep. He reported having trouble with restless leg symptoms in the past. Instances of acting out his dreams: He denied to his knowledge any acting out of dream content. His wife does not reside in bed with him, so this remains challenging to verify. Medical records suggest some REM sleep behaviors in the past around the time new medications were introduced (e.g., blood pressure medication).   Psychiatric/Behavioral Health History: Depression: He reported a somewhat longstanding history of generally mild depressive experiences. He has benefited from medication in the past. When asked his current mood, he emphasized that he tries to stay upbeat. He did allude to some stress surrounding his wife's medical ailments and personal concern surrounding potential cognitive decline. Current or remote suicidal ideation, intent, or plan was denied.  Anxiety: He reported a somewhat longstanding history of generalized anxious distress. Medical records also suggest concerns surrounding a social anxiety disorder.  Mania: Denied. Trauma History: Denied. Visual/auditory hallucinations: Denied. He did describe a single instance many years previously where he felt an angel was speaking to him and providing direction/correction. He acknowledged only hearing this rather than seeing an actual figure and was unclear if this merely represented a very vivid dream as it was in or around sleep. Outside of this isolated experience, he denied any other potential visual or auditory hallucination experiences.  Delusional thoughts: Denied.  Tobacco: Denied. Alcohol: He denied current alcohol consumption as well as a history of problematic alcohol abuse or dependence.  Recreational  drugs: Denied.  Family History: Problem Relation Age of Onset   Dementia Mother        unspecified type   Ulcers Mother    Dementia Father        unspecified type   Heart attack Father    Dementia Paternal Uncle        Lewy body   This information was confirmed by Bryan Sosa.  Academic/Vocational History: Highest level of educational attainment: 12 years. 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 Math represented a relative weakness; however, academic settings were more challenging given underlying attentional dysregulation (see above). He also reported completing some vocational-based courses after high school. History of developmental delay: Denied. History of grade repetition: Denied. Enrollment in special education courses: Denied. History of LD: Denied.  Employment: Retired. 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   Evaluation Results:   Behavioral Observations: Bryan Sosa was unaccompanied, arrived to his appointment on time, and was appropriately dressed and groomed. He appeared alert. He ambulated slowly and with the assistance of crutches given his semi-recent knee sprain. Mild resting tremors in his left hand were observed throughout interview. His affect was generally relaxed and positive. Spontaneous speech was fluent and word finding difficulties were not observed during the clinical interview. Thought processes were coherent, organized, and normal in content. Insight into his cognitive difficulties appeared adequate.   During testing, mild left-handed resting tremors were observed. Sustained attention was appropriate. Task engagement was adequate and he persisted when challenged. Bradyphrenia was noteworthy as he was  slow to respond across a majority of the assessment. Overall, Bryan Sosa was cooperative with the clinical interview and subsequent testing procedures.   Adequacy of Effort: The validity of  neuropsychological testing is limited by the extent to which the individual being tested may be assumed to have exerted adequate effort during testing. Bryan Sosa expressed his intention to perform to the best of his abilities and exhibited adequate task engagement and persistence. Scores across stand-alone and embedded performance validity measures were generally within expectation. Across his sole below expectation performance, his total raw score was a single point below utilized cutoff values. As such, the results of the current evaluation are believed to be a valid representation of Bryan Sosa current cognitive functioning.  Test Results: Bryan Sosa was fully oriented at the time of the current evaluation.  Intellectual abilities based upon educational and vocational attainment were estimated to be in the average range. Premorbid abilities were estimated to be within the average range based upon a single-word reading test.   Processing speed was variable, ranging from the exceptionally low to average normative ranges. Basic attention was below average. More complex attention (e.g., working memory) was also below average. Executive functioning was below average to average.  While not directly assessed, receptive language abilities were believed to be intact. Bryan Sosa did not exhibit any difficulties comprehending task instructions and answered all questions asked of him appropriately. Assessed expressive language was variable. Phonemic fluency was well below average, semantic fluency was below average, and confrontation naming was above average.    Assessed visuospatial/visuoconstructional abilities were variable, ranging from the well below average to above average normative ranges.    Learning (i.e., encoding) of novel verbal and visual information was variable, ranging from the well below average to average normative ranges. Spontaneous delayed recall (i.e., retrieval) of previously learned  information was below average to average. Retention rates were 150% across a list learning task, 113% across a story learning task, 79% across a daily living task, and 67% across a shape learning task. Performance across recognition tasks was variable, ranging from the well below average to average normative ranges, suggesting some evidence for information consolidation.   Results of emotional screening instruments suggested that recent symptoms of generalized anxiety were in the minimal range, while symptoms of depression were within normal limits. A screening instrument assessing recent sleep quality suggested the presence of mild sleep dysfunction.  Table of Scores:   Note: This summary of test scores accompanies the interpretive report and should not be considered in isolation without reference to the appropriate sections in the text. Descriptors are based on appropriate normative data and may be adjusted based on clinical judgment. Terms such as Within Normal Limits and Outside Normal Limits are used when a more specific description of the test score cannot be determined.       Percentile - Normative Descriptor > 98 - Exceptionally High 91-97 - Well Above Average 75-90 - Above Average 25-74 - Average 9-24 - Below Average 2-8 - Well Below Average < 2 - Exceptionally Low       Validity:   DESCRIPTOR       ACS WC: --- --- Outside Normal Limits  DCT: --- --- Within Normal Limits  NAB EVI: --- --- Within Normal Limits  D-KEFS Color Word EI: --- --- Within Normal Limits       Orientation:      Raw Score Percentile   NAB Orientation, Form 1 29/29 --- ---  Intellectual Functioning:      Standard Score Percentile   Test of Premorbid Functioning: 100 50 Average       Memory:     NAB Memory Module, Form 1: Standard Score/ T Score Percentile   Total Memory Index 81 10 Below Average  List Learning       Total Trials 1-3 13/36 (37) 9 Below Average    List B 3/12 (46) 34 Average     Short Delay Free Recall 2/12 (32) 4 Well Below Average    Long Delay Free Recall 3/12 (38) 12 Below Average    Retention Percentage 150 (69) 97 Well Above Average    Recognition Discriminability 3 (37) 9 Below Average  Shape Learning       Total Trials 1-3 14/27 (50) 50 Average    Delayed Recall 4/9 (44) 27 Average    Retention Percentage 67 (40) 16 Below Average    Recognition Discriminability 6 (47) 38 Average  Story Learning       Immediate Recall 34/80 (34) 5 Well Below Average    Delayed Recall 26/40 (45) 31 Average    Retention Percentage 113 (62) 88 Above Average  Daily Living Memory       Immediate Recall 37/51 (46) 34 Average    Delayed Recall 11/17 (44) 27 Average    Retention Percentage 79 (44) 27 Average    Recognition Hits 7/10 (36) 8 Well Below Average       Attention/Executive Function:     Oral Trail Making Test (OTMT): Raw Score (Z-Score) Percentile     Part A 12 secs.,  0 errors (-2.1) 2 Exceptionally Low    Part B 57 secs.,  0 errors (-0.95) 17 Below Average       Symbol Digit Modalities Test (SDMT): Raw Score (Z-Score) Percentile     Oral 33 (-0.8) 21 Below Average        Scaled Score Percentile   WAIS-5 Naming Speed Quantity: 8 25 Average       NAB Attention Module, Form 1: T Score Percentile     Digits Forward 41 18 Below Average    Digits Backwards 39 14 Below Average        Scaled Score Percentile   WAIS-5 Matrix Reasoning: 7 16 Below Average       D-KEFS Color-Word Interference Test: Raw Score (Scaled Score) Percentile     Color Naming 43 secs. (6) 9 Below Average    Word Reading 35 secs. (5) 5 Well Below Average    Inhibition 84 secs. (7) 16 Below Average      Total Errors 1 error (11) 63 Average    Inhibition/Switching 86 secs. (9) 37 Average      Total Errors 6 errors (7) 16 Below Average       D-KEFS Verbal Fluency Test: Raw Score (Scaled Score) Percentile     Letter Total Correct 17 (4) 2 Well Below Average    Category Total  Correct 26 (6) 9 Below Average    Category Switching Total Correct 12 (9) 37 Average    Category Switching Accuracy 11 (10) 50 Average      Total Set Loss Errors 3 (9) 37 Average      Total Repetition Errors 0 (13) 84 Above Average       D-KEFS 20 Questions Test: Scaled Score Percentile     Total Weighted Achievement Score 10 50 Average    Initial Abstraction Score 8 25 Average  Language:     Verbal Fluency Test: Raw Score (T Score) Percentile     Phonemic Fluency (FAS) 17 (30) 2 Well Below Average    Animal Fluency 12 (39) 14 Below Average        NAB Language Module, Form 1: T Score Percentile     Naming 31/31 (57) 75 Above Average       Visuospatial/Visuoconstruction:      Raw Score Percentile   Clock Drawing: 10/10 --- Within Normal Limits  Hooper VOT: 18/30 --- High Probability of Impairment  Judgment of Line Orientation, Form V: 21/30 22 Below Average       NAB Spatial Module, Form 1: T Score Percentile     Figure Drawing Copy 57 75 Above Average        Scaled Score Percentile   WAIS-5 Visual Puzzles: 7 16 Below Average       Mood and Personality:      Raw Score Percentile   Beck Depression Inventory - II: 3 --- Within Normal Limits  PROMIS Anxiety Questionnaire: 13 --- None to Slight       Additional Questionnaires:      Raw Score Percentile   PROMIS Sleep Disturbance Questionnaire: 25 --- Mild   Informed Consent and Coding/Compliance:   The current evaluation represents a clinical evaluation for the purposes previously outlined by the referral source and is in no way reflective of a forensic evaluation.   Bryan Sosa. Taglieri was provided with a verbal description of the nature and purpose of the present neuropsychological evaluation. Also reviewed were the foreseeable risks and/or discomforts and benefits of the procedure, limits of confidentiality, and mandatory reporting requirements of this provider. The patient was given the opportunity to ask questions and receive  answers about the evaluation. Oral consent to participate was provided by the patient.   This evaluation was conducted by Arthea KYM Maryland, Ph.D., ABPP-CN, board certified clinical neuropsychologist. Bryan Sosa. Stauffer completed a clinical interview with Dr. Maryland, billed as one unit [phone], and 180 minutes of cognitive testing and scoring, billed as one unit [phone] and five additional units 96139. Psychometrist Lonell Jude, B.S. assisted Dr. Maryland with test administration and scoring procedures. As a separate and discrete service, one unit [phone] and two units 96133 (170 minutes) were billed for Dr. Loralee time spent in interpretation and report writing.

## 2024-08-01 NOTE — Progress Notes (Signed)
   Psychometrician Note   Cognitive testing was administered to Bryan Sosa by Lonell Jude, B.S. (psychometrist) under the supervision of Dr. Arthea KYM Maryland, Ph.D., ABPP, licensed psychologist on 08/01/2024. [name] did not appear overtly distressed by the testing session per behavioral observation or responses across self-report questionnaires. Rest breaks were offered.   The battery of tests administered was selected by Dr. Zachary C. Merz, Ph.D., ABPP with consideration to [name]'s current level of functioning, the nature of his symptoms, emotional and behavioral responses during interview, level of literacy, observed level of motivation/effort, and the nature of the referral question. This battery was communicated to the psychometrist. Communication between Dr. Arthea KYM Maryland, Ph.D., ABPP and the psychometrist was ongoing throughout the evaluation and Dr. Arthea KYM Maryland, Ph.D., ABPP was immediately accessible at all times. Dr. Zachary C. Merz, Ph.D., ABPP provided supervision to the psychometrist on the date of this service to the extent necessary to assure the quality of all services provided.    Bryan Sosa will return within approximately 1-2 weeks for an interactive feedback session with Dr. Maryland at which time his test performances, clinical impressions, and treatment recommendations will be reviewed in detail. [name] understands he can contact our office should he require our assistance before this time.  A total of 180 minutes of billable time were spent face-to-face with [name] by the psychometrist. This includes both test administration and scoring time. Billing for these services is reflected in the clinical report generated by Dr. Arthea KYM Maryland, Ph.D., ABPP  This note reflects time spent with the psychometrician and does not include test scores or any clinical interpretations made by Dr. Maryland. The full report will follow in a separate note.

## 2024-08-07 ENCOUNTER — Encounter: Admitting: Psychology

## 2024-08-07 ENCOUNTER — Ambulatory Visit: Admitting: Psychology

## 2024-08-07 DIAGNOSIS — F0670 Mild neurocognitive disorder due to known physiological condition without behavioral disturbance: Secondary | ICD-10-CM

## 2024-08-07 NOTE — Progress Notes (Signed)
° °  Neuropsychology Feedback Session [name] [facility] Department of Neurology  Reason for Referral:   Bryan Sosa is a 68 y.o. left-handed Caucasian male referred by Juliene Dunnings, D.O., to characterize his current cognitive functioning and assist with diagnostic clarity and treatment planning in the context of parkinsonian features and concerns for progressive cognitive decline.   Feedback:   Bryan Sosa completed a comprehensive neuropsychological evaluation on 08/01/2024. Please refer to that encounter for the full report and recommendations. Briefly, results suggested an isolated impairment across phonemic fluency, as well as performance variability surrounding processing speed, visuospatial abilities, and both encoding (i.e., learning) and recognition/consolidation aspects of memory. Bryan Sosa pattern of weakness across cognitive testing is nonspecific in nature. There have been previous concerns surrounding Parkinson's disease within his medical records given tremors, bradykinesia, dysphagia, and some balance instability. Bryan Sosa nonspecific pattern of weakness could be reasonably seen in this condition. Cognitive testing is not sufficient to diagnose Parkinson's disease in isolation and only represents a supplementary feature. His reporting does not suggest hallucinations or a REM sleep behavior in a compelling fashion. This, combined with his cognitive profile, makes Lewy body disease less likely relative to idiopathic Parkinson's disease in my opinion. Memory patterns are not worrisome for symptomatic Alzheimer's disease at the present time.  Bryan Sosa was accompanied by his wife during the current feedback session. Content of the current session focused on the results of his neuropsychological evaluation. Bryan Sosa was given the opportunity to ask questions and his questions were answered. He was encouraged to reach out should additional questions arise. A copy  of his report was provided at the conclusion of the visit.      One unit 96132 (33 minutes) was billed for Dr. Loralee time spent preparing for, conducting, and documenting the current feedback session with Bryan Sosa.

## 2024-08-27 ENCOUNTER — Institutional Professional Consult (permissible substitution): Admitting: Psychology

## 2024-08-27 ENCOUNTER — Ambulatory Visit: Payer: Self-pay

## 2024-09-05 ENCOUNTER — Encounter: Admitting: Psychology

## 2024-12-26 ENCOUNTER — Ambulatory Visit: Admitting: Psychiatry

## 2025-02-24 ENCOUNTER — Ambulatory Visit: Admitting: Neurology
